# Patient Record
Sex: Female | Born: 1945
Health system: Southern US, Community
[De-identification: ages and names within clinical notes are randomized; demographics above are authoritative.]

## PROBLEM LIST (undated history)

## (undated) DIAGNOSIS — R51 Headache: Secondary | ICD-10-CM

## (undated) DIAGNOSIS — I10 Essential (primary) hypertension: Secondary | ICD-10-CM

## (undated) HISTORY — PX: ABDOMINAL HYSTERECTOMY: SHX81

## (undated) HISTORY — PX: BREAST EXCISIONAL BIOPSY: SUR124

## (undated) HISTORY — DX: Headache: R51

## (undated) HISTORY — DX: Essential (primary) hypertension: I10

---

## 1998-07-29 ENCOUNTER — Encounter: Payer: Self-pay | Admitting: Emergency Medicine

## 1998-07-29 ENCOUNTER — Emergency Department (HOSPITAL_COMMUNITY): Admission: EM | Admit: 1998-07-29 | Discharge: 1998-07-29 | Payer: Self-pay | Admitting: Emergency Medicine

## 1999-05-24 ENCOUNTER — Other Ambulatory Visit: Admission: RE | Admit: 1999-05-24 | Discharge: 1999-05-24 | Payer: Self-pay | Admitting: Obstetrics and Gynecology

## 1999-08-03 ENCOUNTER — Ambulatory Visit (HOSPITAL_COMMUNITY): Admission: RE | Admit: 1999-08-03 | Discharge: 1999-08-03 | Payer: Self-pay | Admitting: Gastroenterology

## 1999-09-18 ENCOUNTER — Ambulatory Visit (HOSPITAL_COMMUNITY): Admission: RE | Admit: 1999-09-18 | Discharge: 1999-09-18 | Payer: Self-pay | Admitting: Obstetrics and Gynecology

## 1999-09-18 ENCOUNTER — Encounter: Payer: Self-pay | Admitting: Obstetrics and Gynecology

## 2000-02-01 ENCOUNTER — Encounter: Admission: RE | Admit: 2000-02-01 | Discharge: 2000-02-01 | Payer: Self-pay | Admitting: Obstetrics and Gynecology

## 2000-02-01 ENCOUNTER — Encounter: Payer: Self-pay | Admitting: Obstetrics and Gynecology

## 2000-09-24 ENCOUNTER — Encounter: Payer: Self-pay | Admitting: Obstetrics and Gynecology

## 2000-09-24 ENCOUNTER — Ambulatory Visit (HOSPITAL_COMMUNITY): Admission: RE | Admit: 2000-09-24 | Discharge: 2000-09-24 | Payer: Self-pay | Admitting: Obstetrics and Gynecology

## 2001-05-11 ENCOUNTER — Encounter: Payer: Self-pay | Admitting: Family Medicine

## 2001-05-11 ENCOUNTER — Ambulatory Visit (HOSPITAL_COMMUNITY): Admission: RE | Admit: 2001-05-11 | Discharge: 2001-05-11 | Payer: Self-pay | Admitting: Family Medicine

## 2001-07-01 ENCOUNTER — Other Ambulatory Visit: Admission: RE | Admit: 2001-07-01 | Discharge: 2001-07-01 | Payer: Self-pay | Admitting: Obstetrics and Gynecology

## 2001-09-30 ENCOUNTER — Ambulatory Visit (HOSPITAL_COMMUNITY): Admission: RE | Admit: 2001-09-30 | Discharge: 2001-09-30 | Payer: Self-pay | Admitting: Obstetrics and Gynecology

## 2001-09-30 ENCOUNTER — Encounter: Payer: Self-pay | Admitting: Obstetrics and Gynecology

## 2002-09-21 ENCOUNTER — Ambulatory Visit (HOSPITAL_COMMUNITY): Admission: RE | Admit: 2002-09-21 | Discharge: 2002-09-21 | Payer: Self-pay | Admitting: Family Medicine

## 2002-09-21 ENCOUNTER — Encounter: Payer: Self-pay | Admitting: Family Medicine

## 2002-09-30 ENCOUNTER — Ambulatory Visit (HOSPITAL_COMMUNITY): Admission: RE | Admit: 2002-09-30 | Discharge: 2002-09-30 | Payer: Self-pay | Admitting: Obstetrics and Gynecology

## 2002-09-30 ENCOUNTER — Encounter: Payer: Self-pay | Admitting: Obstetrics and Gynecology

## 2003-04-15 ENCOUNTER — Encounter (INDEPENDENT_AMBULATORY_CARE_PROVIDER_SITE_OTHER): Payer: Self-pay | Admitting: Specialist

## 2003-04-15 ENCOUNTER — Ambulatory Visit (HOSPITAL_COMMUNITY): Admission: RE | Admit: 2003-04-15 | Discharge: 2003-04-15 | Payer: Self-pay | Admitting: Gastroenterology

## 2003-11-18 ENCOUNTER — Ambulatory Visit (HOSPITAL_COMMUNITY): Admission: RE | Admit: 2003-11-18 | Discharge: 2003-11-18 | Payer: Self-pay | Admitting: Obstetrics and Gynecology

## 2004-11-19 ENCOUNTER — Ambulatory Visit (HOSPITAL_COMMUNITY): Admission: RE | Admit: 2004-11-19 | Discharge: 2004-11-19 | Payer: Self-pay | Admitting: Obstetrics and Gynecology

## 2006-01-01 ENCOUNTER — Encounter: Admission: RE | Admit: 2006-01-01 | Discharge: 2006-01-01 | Payer: Self-pay | Admitting: Obstetrics and Gynecology

## 2007-01-07 ENCOUNTER — Encounter: Admission: RE | Admit: 2007-01-07 | Discharge: 2007-01-07 | Payer: Self-pay | Admitting: Obstetrics and Gynecology

## 2007-10-30 ENCOUNTER — Encounter: Admission: RE | Admit: 2007-10-30 | Discharge: 2007-10-30 | Payer: Self-pay | Admitting: Gastroenterology

## 2008-01-21 ENCOUNTER — Encounter: Admission: RE | Admit: 2008-01-21 | Discharge: 2008-01-21 | Payer: Self-pay | Admitting: Obstetrics and Gynecology

## 2009-01-24 ENCOUNTER — Encounter: Admission: RE | Admit: 2009-01-24 | Discharge: 2009-01-24 | Payer: Self-pay | Admitting: Obstetrics and Gynecology

## 2009-05-26 ENCOUNTER — Ambulatory Visit: Payer: Self-pay | Admitting: Vascular Surgery

## 2010-01-25 ENCOUNTER — Encounter: Admission: RE | Admit: 2010-01-25 | Discharge: 2010-01-25 | Payer: Self-pay | Admitting: Family Medicine

## 2010-10-14 ENCOUNTER — Encounter: Payer: Self-pay | Admitting: Obstetrics and Gynecology

## 2010-10-14 ENCOUNTER — Encounter: Payer: Self-pay | Admitting: Family Medicine

## 2011-01-07 ENCOUNTER — Other Ambulatory Visit: Payer: Self-pay | Admitting: Family Medicine

## 2011-01-07 DIAGNOSIS — Z1231 Encounter for screening mammogram for malignant neoplasm of breast: Secondary | ICD-10-CM

## 2011-02-06 ENCOUNTER — Ambulatory Visit
Admission: RE | Admit: 2011-02-06 | Discharge: 2011-02-06 | Disposition: A | Discharge status: Home / Self Care | Payer: BC Managed Care – PPO | Source: Ambulatory Visit | Attending: Family Medicine | Admitting: Family Medicine

## 2011-02-06 DIAGNOSIS — Z1231 Encounter for screening mammogram for malignant neoplasm of breast: Secondary | ICD-10-CM

## 2011-02-08 NOTE — Op Note (Signed)
NAMEMICHAELINA, Brianna Horne                          ACCOUNT NO.:  [id number]   MEDICAL RECORD NO.:  [id number]                   PATIENT TYPE:  AMB   LOCATION:  ENDO                                 FACILITY:  [facility]   PHYSICIAN:  Petra Kuba, M.D.                 DATE OF BIRTH:  12/30/1945   DATE OF PROCEDURE:  04/15/2003  DATE OF DISCHARGE:                                 OPERATIVE REPORT   PROCEDURE:  Colonoscopy with biopsy.   INDICATIONS:  The patient has recurring bouts of bloody diarrhea, abdominal  pain.  Consent was signed after risks, benefits, methods, and options were  thoroughly discussed in the office.   PREMEDICATIONS:  Demerol 60 mg, Versed 10 mg.   DESCRIPTION OF PROCEDURE:  Rectal inspection pertinent for external  hemorrhoids, small.  Digital exam was negative.  Pediatric video adjustable  colonoscope was inserted and with some difficulty due to a tortuous sigmoid.  Once passed that area, was easily able to advance to the cecum.  This did  require some abdominal pressure but no position changes.  On insertion of  the sigmoid, some scattered inflammation was seen but no chronic ulceration.  No other abnormalities were seen.  It was readvanced to the cecum.  The  cecum was identified by the appendiceal orifice and ileocecal valve.  The  scope was inserted a short ways into the terminal ileum which was normal.  Photo documentation was obtained.  The scope was slowly withdrawn.  Prep was  adequate.  There was minimal some liquid stool that required suctioning in  the lumen.  Slow withdrawal through the colon, the cecum, ascending,  transverse, and the majority of the descending was normal.  There was a rare  left-sided diverticula.  Inflammation that was seen on insertion was  confirmed.  It looked a little more impressive than just the usual prep-  induced inflammation and multiple biopsies were obtained.  No other  abnormalities were seen as we slowly returned back to  the rectum.  Anorectal  pull-through and retroflexion confirmed some hemorrhoids.  The scope was  straightened and readvanced a short ways up the left side of the colon.  Air  was suctioned and the scope removed.  The patient tolerated the procedure  well.  There were no obvious immediate complications.   ENDOSCOPIC DIAGNOSES:  1. Small internal and external hemorrhoids.  2. Rare left-sided diverticula.  3. Tortuous sigmoid.  4. Minimal sigmoid inflammation, status post biopsy.  5. Otherwise within normal limits to the terminal ileum.    PLAN:  Await pathology but I think her symptoms are due to current bouts of  colonic ischemia, probably to her Premarin.  Would recommend she stop the  Premarin.  Talk to her gynecologist about other options if she has hot  flashes or other symptoms.  Possibly just using a lower dose of estrogen can  be  tried.  I will see her back p.r.n. or in two months.  Based on no other  abnormalities, repeat screening in five years.                                                 Petra Kuba, M.D.    MEM/MEDQ  D:  04/15/2003  T:  04/15/2003  Job:  [id number]   cc:   Dario Guardian, M.D.  [address]  [place]  [place] [address]  Fax: [phone]

## 2011-05-13 ENCOUNTER — Other Ambulatory Visit: Payer: Self-pay | Admitting: Family Medicine

## 2011-05-14 ENCOUNTER — Ambulatory Visit
Admission: RE | Admit: 2011-05-14 | Discharge: 2011-05-14 | Disposition: A | Discharge status: Home / Self Care | Payer: BC Managed Care – PPO | Source: Ambulatory Visit | Attending: Family Medicine | Admitting: Family Medicine

## 2012-01-01 ENCOUNTER — Other Ambulatory Visit: Payer: Self-pay | Admitting: Vascular Surgery

## 2012-02-05 ENCOUNTER — Other Ambulatory Visit: Payer: Self-pay | Admitting: Family Medicine

## 2012-02-05 DIAGNOSIS — Z1231 Encounter for screening mammogram for malignant neoplasm of breast: Secondary | ICD-10-CM

## 2012-02-19 ENCOUNTER — Ambulatory Visit: Payer: BC Managed Care – PPO

## 2012-02-20 ENCOUNTER — Ambulatory Visit
Admission: RE | Admit: 2012-02-20 | Discharge: 2012-02-20 | Disposition: A | Discharge status: Home / Self Care | Payer: BC Managed Care – PPO | Source: Ambulatory Visit | Attending: Family Medicine | Admitting: Family Medicine

## 2012-02-20 DIAGNOSIS — Z1231 Encounter for screening mammogram for malignant neoplasm of breast: Secondary | ICD-10-CM

## 2012-10-20 ENCOUNTER — Other Ambulatory Visit: Payer: Self-pay | Admitting: Family Medicine

## 2012-10-20 DIAGNOSIS — Z1231 Encounter for screening mammogram for malignant neoplasm of breast: Secondary | ICD-10-CM

## 2012-11-12 ENCOUNTER — Encounter: Payer: Self-pay | Admitting: Licensed Clinical Social Worker

## 2012-11-12 NOTE — Progress Notes (Signed)
Patient ID: Brianna Horne, female   DOB: 23-Sep-1946, 67 y.o.   MRN: [id number] Confirmed, pt has changed [facility] provider to [facility] [phone].

## 2013-01-11 ENCOUNTER — Encounter: Payer: Self-pay | Admitting: *Deleted

## 2013-02-04 ENCOUNTER — Emergency Department (HOSPITAL_COMMUNITY)
Admission: EM | Admit: 2013-02-04 | Discharge: 2013-02-04 | Disposition: A | Discharge status: Home / Self Care | Payer: Medicare Other | Attending: Emergency Medicine | Admitting: Emergency Medicine

## 2013-02-04 ENCOUNTER — Emergency Department (HOSPITAL_COMMUNITY): Payer: Medicare Other

## 2013-02-04 ENCOUNTER — Encounter (HOSPITAL_COMMUNITY): Payer: Self-pay | Admitting: *Deleted

## 2013-02-04 DIAGNOSIS — Z7982 Long term (current) use of aspirin: Secondary | ICD-10-CM | POA: Insufficient documentation

## 2013-02-04 DIAGNOSIS — R079 Chest pain, unspecified: Secondary | ICD-10-CM

## 2013-02-04 DIAGNOSIS — Z79899 Other long term (current) drug therapy: Secondary | ICD-10-CM | POA: Insufficient documentation

## 2013-02-04 DIAGNOSIS — M25519 Pain in unspecified shoulder: Secondary | ICD-10-CM | POA: Insufficient documentation

## 2013-02-04 DIAGNOSIS — R071 Chest pain on breathing: Secondary | ICD-10-CM | POA: Insufficient documentation

## 2013-02-04 DIAGNOSIS — M546 Pain in thoracic spine: Secondary | ICD-10-CM | POA: Insufficient documentation

## 2013-02-04 LAB — POCT I-STAT, CHEM 8
BUN: 13 mg/dL (ref 6–23)
Creatinine, Ser: 0.7 mg/dL (ref 0.50–1.10)
Glucose, Bld: 93 mg/dL (ref 70–99)
Sodium: 142 mEq/L (ref 135–145)
TCO2: 29 mmol/L (ref 0–100)

## 2013-02-04 LAB — CBC WITH DIFFERENTIAL/PLATELET
Basophils Relative: 1 % (ref 0–1)
Eosinophils Absolute: 0.1 10*3/uL (ref 0.0–0.7)
Eosinophils Relative: 1 % (ref 0–5)
MCH: 31.9 pg (ref 26.0–34.0)
MCHC: 35.5 g/dL (ref 30.0–36.0)
MCV: 89.7 fL (ref 78.0–100.0)
Neutrophils Relative %: 51 % (ref 43–77)
Platelets: 232 10*3/uL (ref 150–400)
RDW: 12.5 % (ref 11.5–15.5)

## 2013-02-04 LAB — POCT I-STAT TROPONIN I

## 2013-02-04 NOTE — ED Notes (Signed)
Urine sample collected from patient. 

## 2013-02-04 NOTE — ED Notes (Signed)
Patient states she started having pain in back around under shoulder blades and into left shoulder, patient denies chest pain and states she has been working outside and may have strained back, patient also states she has been taking her blood pressure at home and has been getting abnormal readings

## 2013-02-04 NOTE — ED Provider Notes (Signed)
History     CSN: [id number]  Arrival date & time 02/04/13  4098   First MD Initiated Contact with Patient 02/04/13 [phone]      Chief Complaint  Patient presents with  . Back Pain  . Shoulder Pain    (Consider location/radiation/quality/duration/timing/severity/associated sxs/prior treatment) Patient is a 67 y.o. female presenting with chest pain. The history is provided by the patient. No language interpreter was used.  Chest Pain Chest pain location: Patient is a 67 year old woman who feels pain in her left posterior chest goes down her left arm to her fingers. She says there is been no injury. She has written a locked riding lawnmower a lot in the past few days. Last night she had the pain more severe. Pain quality: aching   Pain radiates to:  L shoulder and L arm Pain radiates to the back: no (Pain is felt in the left posterior chest wall. )   Pain severity:  Mild Onset quality:  Gradual Duration: Intermittent brief episodes of aching in left posterior chest. Progression:  Waxing and waning Chronicity:  New Context comment:  She thought the pain might have come from riding a riding lawnmower. Relieved by:  Nothing Associated symptoms: back pain   Associated symptoms: no fever   Associated symptoms comment:  Recent treatment of high blood pressure for the past 3 weeks with lisinopril 10 mg per day. Risk factors: no coronary artery disease     History reviewed. No pertinent past medical history.  Past Surgical History  Procedure Laterality Date  . Abdominal hysterectomy      No family history on file.  History  Substance Use Topics  . Smoking status: Never Smoker   . Smokeless tobacco: Not on file  . Alcohol Use: Yes     Comment: wine    OB History   Grav Para Term Preterm Abortions TAB SAB Ect Mult Living                  Review of Systems  Constitutional: Negative for fever and chills.  HENT: Negative.   Eyes: Negative.   Respiratory: Negative.    Cardiovascular: Positive for chest pain.  Gastrointestinal: Negative.   Genitourinary: Negative.   Musculoskeletal: Positive for back pain.       Pain in left posterior chest between left scapula and spine.  Skin: Negative.   Neurological: Negative.   Psychiatric/Behavioral: Negative.     Allergies  Review of patient's allergies indicates no known allergies.  Home Medications   Current Outpatient Rx  Name  Route  Sig  Dispense  Refill  . aspirin EC 81 MG tablet   Oral   Take 81 mg by mouth daily.         . calcium carbonate (OS-CAL) 600 MG TABS   Oral   Take 600 mg by mouth daily.         . Cholecalciferol 1000 UNITS tablet   Oral   Take 1,000 Units by mouth daily.         . cyanocobalamin 1000 MCG tablet   Oral   Take 100 mcg by mouth daily.         Marland Kitchen ibuprofen (ADVIL,MOTRIN) 200 MG tablet   Oral   Take 400 mg by mouth every 6 (six) hours as needed for pain.         Marland Kitchen lisinopril (PRINIVIL,ZESTRIL) 10 MG tablet   Oral   Take 10 mg by mouth daily.         Marland Kitchen  naproxen sodium (ANAPROX) 220 MG tablet   Oral   Take 440 mg by mouth 2 (two) times daily as needed. pain         . vitamin C (ASCORBIC ACID) 500 MG tablet   Oral   Take 500 mg by mouth daily.           BP 142/73  Pulse 69  Temp(Src) 97.5 F (36.4 C) (Oral)  Resp 13  SpO2 100%  Physical Exam  Nursing note and vitals reviewed. Constitutional: She is oriented to person, place, and time. She appears well-developed and well-nourished. No distress.  HENT:  Head: Normocephalic and atraumatic.  Right Ear: External ear normal.  Left Ear: External ear normal.  Eyes: Conjunctivae and EOM are normal. Pupils are equal, round, and reactive to light.  Neck: Normal range of motion. Neck supple.  Cardiovascular: Normal rate, regular rhythm and normal heart sounds.   Pulmonary/Chest: Effort normal and breath sounds normal.  She localizes pain to the left posterior chest wall, between the left  scapula and the thoracic spine.  There is no palpable deformity, no point tenderness, no crepitance or subcutaneous emphysema.    Abdominal: Soft. Bowel sounds are normal.  Musculoskeletal: Normal range of motion. She exhibits no edema and no tenderness.  Neurological: She is alert and oriented to person, place, and time.  No sensory or motor deficit.  Skin: Skin is warm and dry.  Psychiatric: She has a normal mood and affect. Her behavior is normal.    ED Course  Procedures (including critical care time)  9:27 AM  Date: 02/04/2013  Rate: 71  Rhythm: normal sinus rhythm  QRS Axis: normal  Intervals: normal  ST/T Wave abnormalities: normal  Conduction Disutrbances:none  Narrative Interpretation: Normal EKG  Old EKG Reviewed: none available  10:15 AM Pt was seen and had physical examination.  EKG is benign.  Lab tests and chest x-ray were ordered.  12:08 PM Results for orders placed during the hospital encounter of 02/04/13  CBC WITH DIFFERENTIAL      Result Value Range   WBC 5.9  4.0 - 10.5 K/uL   RBC 4.08  3.87 - 5.11 MIL/uL   Hemoglobin 13.0  12.0 - 15.0 g/dL   HCT 96.0  45.4 - 09.8 %   MCV 89.7  78.0 - 100.0 fL   MCH 31.9  26.0 - 34.0 pg   MCHC 35.5  30.0 - 36.0 g/dL   RDW 11.9  14.7 - 82.9 %   Platelets 232  150 - 400 K/uL   Neutrophils Relative % 51  43 - 77 %   Neutro Abs 3.0  1.7 - 7.7 K/uL   Lymphocytes Relative 40  12 - 46 %   Lymphs Abs 2.4  0.7 - 4.0 K/uL   Monocytes Relative 7  3 - 12 %   Monocytes Absolute 0.4  0.1 - 1.0 K/uL   Eosinophils Relative 1  0 - 5 %   Eosinophils Absolute 0.1  0.0 - 0.7 K/uL   Basophils Relative 1  0 - 1 %   Basophils Absolute 0.1  0.0 - 0.1 K/uL  POCT I-STAT, CHEM 8      Result Value Range   Sodium 142  135 - 145 mEq/L   Potassium 4.5  3.5 - 5.1 mEq/L   Chloride 106  96 - 112 mEq/L   BUN 13  6 - 23 mg/dL   Creatinine, Ser 5.62  0.50 - 1.10 mg/dL   Glucose, Bld  93  70 - 99 mg/dL   Calcium, Ion 1.61  0.96 - 1.30 mmol/L    TCO2 29  0 - 100 mmol/L   Hemoglobin 12.9  12.0 - 15.0 g/dL   HCT 04.5  40.9 - 81.1 %  POCT I-STAT TROPONIN I      Result Value Range   Troponin i, poc 0.00  0.00 - 0.08 ng/mL   Comment 3            Dg Chest Port 1 View  02/04/2013   *RADIOLOGY REPORT*  Clinical Data: Chest pain  PORTABLE CHEST - 1 VIEW  Comparison: None.  Findings: Normal heart size and normal vascularity.  Lungs are clear without infiltrate effusion or mass.  IMPRESSION: No acute abnormality.   Original Report Authenticated By: Janeece Riggers, M.D.    Lab workup is entirely negative. Pt is asymptomatic.  Advised that no serious cause of chest pain was found.  Released.  F/U with Merri Brunette, M.D., her family physician.  1. Nonspecific chest pain            Carleene Cooper III, MD 02/04/13 [phone]

## 2013-02-10 ENCOUNTER — Ambulatory Visit (INDEPENDENT_AMBULATORY_CARE_PROVIDER_SITE_OTHER): Payer: Medicare Other | Admitting: Nurse Practitioner

## 2013-02-10 ENCOUNTER — Encounter: Payer: Self-pay | Admitting: Nurse Practitioner

## 2013-02-10 VITALS — BP 110/72 | HR 83 | Ht 63.5 in | Wt 138.0 lb

## 2013-02-10 DIAGNOSIS — R519 Headache, unspecified: Secondary | ICD-10-CM | POA: Insufficient documentation

## 2013-02-10 DIAGNOSIS — R9409 Abnormal results of other function studies of central nervous system: Secondary | ICD-10-CM

## 2013-02-10 DIAGNOSIS — R51 Headache: Secondary | ICD-10-CM

## 2013-02-10 NOTE — Patient Instructions (Addendum)
Patient will have  MRI of the brain repeated in 2016 Followup when necessary

## 2013-02-10 NOTE — Progress Notes (Signed)
HPI: Patient returns for followup after last visit 02/11/2012. She has a history of migraine which is in excellent control and she is no longer taking medication. She also has a history of abnormal MRI of the brain interpreted as small vessel disease she had repeat MRI in January of 2010 without change and again in 12/26/2011. Doppler studies have been negative in the past she takes aspirin daily. Her last headache was several months ago. She has no new complaints today  ROS:  -  flushing, joint pain, aching muscles  Physical Exam General: well developed, well nourished, seated, in no evident distress Head: head normocephalic and atraumatic. Oropharynx benign Neck: supple with no carotid or supraclavicular bruits Cardiovascular: regular rate and rhythm, no murmurs  Neurologic Exam Mental Status: Awake and fully alert. Oriented to place and time.  Follows all commands. Speech and language are normal.  Cranial Nerves:  Pupils equal, briskly reactive to light. Extraocular movements full without nystagmus. Visual fields full to confrontation. Hearing intact and symmetric to finger snap. Facial sensation intact. Face, tongue, palate move normally and symmetrically. Neck flexion and extension normal.  Motor: Normal bulk and tone. Normal strength in all tested extremity muscles. No focal weakness Sensory.: intact to touch and pinprick and vibratory.  Coordination: Rapid alternating movements normal in all extremities. Finger-to-nose and heel-to-shin performed accurately bilaterally. Gait and Station: Arises from chair without difficulty. Stance is normal. Gait demonstrates normal stride length and balance . Able to heel, toe and tandem walkwithout difficulty.  Reflexes: 2+ and symmetric. Toes downgoing.     ASSESSMENT: History of migraines in good control without medication. Last headache was several months ago. Abnormal MRI of the brain interpreted as small vessel disease with last MRI April  2013.     PLAN: Continue ASA daily Will follow up when necessary Will recheck MRI of the brain in 2016  Brianna Horne, [place] APRN

## 2013-02-23 ENCOUNTER — Ambulatory Visit
Admission: RE | Admit: 2013-02-23 | Discharge: 2013-02-23 | Disposition: A | Discharge status: Home / Self Care | Payer: Medicare Other | Source: Ambulatory Visit | Attending: Family Medicine | Admitting: Family Medicine

## 2013-02-23 DIAGNOSIS — Z1231 Encounter for screening mammogram for malignant neoplasm of breast: Secondary | ICD-10-CM

## 2013-03-11 ENCOUNTER — Other Ambulatory Visit: Payer: Self-pay | Admitting: Otolaryngology

## 2013-03-11 ENCOUNTER — Ambulatory Visit
Admission: RE | Admit: 2013-03-11 | Discharge: 2013-03-11 | Disposition: A | Discharge status: Home / Self Care | Payer: Medicare Other | Source: Ambulatory Visit | Attending: Otolaryngology | Admitting: Otolaryngology

## 2013-03-11 DIAGNOSIS — J3489 Other specified disorders of nose and nasal sinuses: Secondary | ICD-10-CM

## 2013-03-11 DIAGNOSIS — R0981 Nasal congestion: Secondary | ICD-10-CM

## 2014-02-18 ENCOUNTER — Other Ambulatory Visit: Payer: Self-pay

## 2014-02-18 DIAGNOSIS — Z1231 Encounter for screening mammogram for malignant neoplasm of breast: Secondary | ICD-10-CM

## 2014-02-24 ENCOUNTER — Ambulatory Visit: Payer: Medicare Other

## 2014-03-01 ENCOUNTER — Other Ambulatory Visit: Payer: Self-pay

## 2014-03-01 ENCOUNTER — Ambulatory Visit
Admission: RE | Admit: 2014-03-01 | Discharge: 2014-03-01 | Disposition: A | Discharge status: Home / Self Care | Payer: Medicare Other | Source: Ambulatory Visit

## 2014-03-01 ENCOUNTER — Encounter (INDEPENDENT_AMBULATORY_CARE_PROVIDER_SITE_OTHER): Payer: Self-pay

## 2014-03-01 DIAGNOSIS — Z1231 Encounter for screening mammogram for malignant neoplasm of breast: Secondary | ICD-10-CM

## 2014-04-28 ENCOUNTER — Ambulatory Visit
Admission: RE | Admit: 2014-04-28 | Discharge: 2014-04-28 | Disposition: A | Discharge status: Home / Self Care | Payer: Medicare Other | Source: Ambulatory Visit | Attending: Family Medicine | Admitting: Family Medicine

## 2014-04-28 ENCOUNTER — Other Ambulatory Visit: Payer: Self-pay | Admitting: Family Medicine

## 2014-04-28 DIAGNOSIS — R0789 Other chest pain: Secondary | ICD-10-CM

## 2015-02-02 ENCOUNTER — Other Ambulatory Visit: Payer: Self-pay

## 2015-02-02 DIAGNOSIS — Z1231 Encounter for screening mammogram for malignant neoplasm of breast: Secondary | ICD-10-CM

## 2015-03-07 ENCOUNTER — Ambulatory Visit
Admission: RE | Admit: 2015-03-07 | Discharge: 2015-03-07 | Disposition: A | Discharge status: Home / Self Care | Payer: Medicare Other | Source: Ambulatory Visit

## 2015-03-07 DIAGNOSIS — Z1231 Encounter for screening mammogram for malignant neoplasm of breast: Secondary | ICD-10-CM

## 2015-06-08 ENCOUNTER — Encounter: Payer: Self-pay | Admitting: Cardiology

## 2015-06-15 ENCOUNTER — Encounter: Payer: Self-pay | Admitting: Internal Medicine

## 2016-02-05 ENCOUNTER — Other Ambulatory Visit: Payer: Self-pay

## 2016-02-05 DIAGNOSIS — Z1231 Encounter for screening mammogram for malignant neoplasm of breast: Secondary | ICD-10-CM

## 2016-03-11 ENCOUNTER — Ambulatory Visit
Admission: RE | Admit: 2016-03-11 | Discharge: 2016-03-11 | Disposition: A | Discharge status: Home / Self Care | Payer: Medicare Other | Source: Ambulatory Visit

## 2016-03-11 DIAGNOSIS — Z1231 Encounter for screening mammogram for malignant neoplasm of breast: Secondary | ICD-10-CM

## 2016-08-10 ENCOUNTER — Emergency Department (HOSPITAL_COMMUNITY): Payer: Medicare Other

## 2016-08-10 ENCOUNTER — Encounter (HOSPITAL_COMMUNITY): Payer: Self-pay

## 2016-08-10 ENCOUNTER — Observation Stay (HOSPITAL_COMMUNITY)
Admission: EM | Admit: 2016-08-10 | Discharge: 2016-08-11 | Disposition: A | Discharge status: Home / Self Care | Payer: Medicare Other | Attending: Family Medicine | Admitting: Family Medicine

## 2016-08-10 DIAGNOSIS — R112 Nausea with vomiting, unspecified: Secondary | ICD-10-CM | POA: Diagnosis not present

## 2016-08-10 DIAGNOSIS — S52371A Galeazzi's fracture of right radius, initial encounter for closed fracture: Principal | ICD-10-CM | POA: Insufficient documentation

## 2016-08-10 DIAGNOSIS — S52501A Unspecified fracture of the lower end of right radius, initial encounter for closed fracture: Secondary | ICD-10-CM

## 2016-08-10 DIAGNOSIS — S62101A Fracture of unspecified carpal bone, right wrist, initial encounter for closed fracture: Secondary | ICD-10-CM

## 2016-08-10 DIAGNOSIS — Z7982 Long term (current) use of aspirin: Secondary | ICD-10-CM | POA: Diagnosis not present

## 2016-08-10 DIAGNOSIS — S52251A Displaced comminuted fracture of shaft of ulna, right arm, initial encounter for closed fracture: Secondary | ICD-10-CM | POA: Diagnosis not present

## 2016-08-10 DIAGNOSIS — S52601A Unspecified fracture of lower end of right ulna, initial encounter for closed fracture: Secondary | ICD-10-CM

## 2016-08-10 DIAGNOSIS — S2222XA Fracture of body of sternum, initial encounter for closed fracture: Secondary | ICD-10-CM | POA: Insufficient documentation

## 2016-08-10 DIAGNOSIS — Z79899 Other long term (current) drug therapy: Secondary | ICD-10-CM | POA: Diagnosis not present

## 2016-08-10 DIAGNOSIS — Z87891 Personal history of nicotine dependence: Secondary | ICD-10-CM | POA: Diagnosis not present

## 2016-08-10 DIAGNOSIS — S62109A Fracture of unspecified carpal bone, unspecified wrist, initial encounter for closed fracture: Secondary | ICD-10-CM

## 2016-08-10 DIAGNOSIS — Z7983 Long term (current) use of bisphosphonates: Secondary | ICD-10-CM | POA: Insufficient documentation

## 2016-08-10 DIAGNOSIS — E876 Hypokalemia: Secondary | ICD-10-CM | POA: Diagnosis not present

## 2016-08-10 DIAGNOSIS — T1490XA Injury, unspecified, initial encounter: Secondary | ICD-10-CM | POA: Diagnosis not present

## 2016-08-10 LAB — CBC WITH DIFFERENTIAL/PLATELET
BASOS PCT: 0 %
Basophils Absolute: 0 10*3/uL (ref 0.0–0.1)
EOS ABS: 0.1 10*3/uL (ref 0.0–0.7)
EOS PCT: 1 %
HCT: 37.8 % (ref 36.0–46.0)
Hemoglobin: 12.7 g/dL (ref 12.0–15.0)
LYMPHS ABS: 1.7 10*3/uL (ref 0.7–4.0)
Lymphocytes Relative: 19 %
MCH: 31.2 pg (ref 26.0–34.0)
MCHC: 33.6 g/dL (ref 30.0–36.0)
MCV: 92.9 fL (ref 78.0–100.0)
Monocytes Absolute: 0.6 10*3/uL (ref 0.1–1.0)
Monocytes Relative: 7 %
NEUTROS PCT: 73 %
Neutro Abs: 6.4 10*3/uL (ref 1.7–7.7)
PLATELETS: 210 10*3/uL (ref 150–400)
RBC: 4.07 MIL/uL (ref 3.87–5.11)
RDW: 12.6 % (ref 11.5–15.5)
WBC: 8.8 10*3/uL (ref 4.0–10.5)

## 2016-08-10 LAB — BASIC METABOLIC PANEL
Anion gap: 8 (ref 5–15)
BUN: 14 mg/dL (ref 6–20)
CALCIUM: 9.5 mg/dL (ref 8.9–10.3)
CHLORIDE: 107 mmol/L (ref 101–111)
CO2: 26 mmol/L (ref 22–32)
CREATININE: 0.68 mg/dL (ref 0.44–1.00)
Glucose, Bld: 155 mg/dL — ABNORMAL HIGH (ref 65–99)
Potassium: 3.2 mmol/L — ABNORMAL LOW (ref 3.5–5.1)
SODIUM: 141 mmol/L (ref 135–145)

## 2016-08-10 MED ORDER — ACETAMINOPHEN 650 MG RE SUPP: 650.0000 mg | Freq: Four times a day (QID) | RECTAL | Status: DC | PRN

## 2016-08-10 MED ORDER — ONDANSETRON HCL 4 MG/2ML IJ SOLN: 4.0000 mg | Freq: Four times a day (QID) | INTRAMUSCULAR | Status: DC | PRN

## 2016-08-10 MED ORDER — SODIUM CHLORIDE 0.9 % IV SOLN
INTRAVENOUS | Status: DC
  Administered 2016-08-10: via INTRAVENOUS

## 2016-08-10 MED ORDER — MORPHINE SULFATE (PF) 4 MG/ML IV SOLN
4.0000 mg | Freq: Once | INTRAVENOUS | Status: AC
  Administered 2016-08-10: 4 mg via INTRAVENOUS
  Filled 2016-08-10: qty 1

## 2016-08-10 MED ORDER — IOPAMIDOL (ISOVUE-300) INJECTION 61%
INTRAVENOUS | Status: AC
  Administered 2016-08-10: 100 mL
  Filled 2016-08-10: qty 100

## 2016-08-10 MED ORDER — ONDANSETRON HCL 4 MG PO TABS: 4.0000 mg | ORAL_TABLET | Freq: Four times a day (QID) | ORAL | Status: DC | PRN

## 2016-08-10 MED ORDER — PROCHLORPERAZINE EDISYLATE 5 MG/ML IJ SOLN
10.0000 mg | Freq: Once | INTRAMUSCULAR | Status: AC
  Administered 2016-08-10: 10 mg via INTRAVENOUS
  Filled 2016-08-10: qty 2

## 2016-08-10 MED ORDER — ACETAMINOPHEN 325 MG PO TABS: 650.0000 mg | ORAL_TABLET | Freq: Four times a day (QID) | ORAL | Status: DC | PRN

## 2016-08-10 MED ORDER — HYDROCODONE-ACETAMINOPHEN 5-325 MG PO TABS
1.0000 | ORAL_TABLET | Freq: Once | ORAL | Status: AC
Start: 2016-08-10 — End: 2016-08-10
  Administered 2016-08-10: 1 via ORAL
  Filled 2016-08-10: qty 1

## 2016-08-10 MED ORDER — HYDROCODONE-ACETAMINOPHEN 5-325 MG PO TABS: 1.0000 | ORAL_TABLET | ORAL | 0 refills | Status: DC | PRN

## 2016-08-10 MED ORDER — ONDANSETRON 4 MG PO TBDP: 4.0000 mg | ORAL_TABLET | Freq: Three times a day (TID) | ORAL | 0 refills | Status: DC | PRN

## 2016-08-10 MED ORDER — IBUPROFEN 200 MG PO TABS: 400.0000 mg | ORAL_TABLET | Freq: Four times a day (QID) | ORAL | Status: DC | PRN

## 2016-08-10 MED ORDER — POTASSIUM CHLORIDE CRYS ER 20 MEQ PO TBCR
40.0000 meq | EXTENDED_RELEASE_TABLET | Freq: Once | ORAL | Status: AC
  Administered 2016-08-10: 40 meq via ORAL
  Filled 2016-08-10: qty 2

## 2016-08-10 MED ORDER — SODIUM CHLORIDE 0.9 % IV BOLUS (SEPSIS)
1000.0000 mL | Freq: Once | INTRAVENOUS | Status: AC
  Administered 2016-08-10: 1000 mL via INTRAVENOUS

## 2016-08-10 MED ORDER — METOCLOPRAMIDE HCL 5 MG/ML IJ SOLN
10.0000 mg | Freq: Once | INTRAMUSCULAR | Status: DC
  Filled 2016-08-10: qty 2

## 2016-08-10 MED ORDER — ONDANSETRON HCL 4 MG/2ML IJ SOLN
4.0000 mg | Freq: Once | INTRAMUSCULAR | Status: AC
  Administered 2016-08-10: 4 mg via INTRAVENOUS
  Filled 2016-08-10: qty 2

## 2016-08-10 MED ORDER — FENTANYL CITRATE (PF) 100 MCG/2ML IJ SOLN
12.5000 ug | INTRAMUSCULAR | Status: DC | PRN
  Administered 2016-08-11 (×2): 12.5 ug via INTRAVENOUS
  Filled 2016-08-10 (×2): qty 2

## 2016-08-10 MED ORDER — ENOXAPARIN SODIUM 40 MG/0.4ML ~~LOC~~ SOLN: 40.0000 mg | SUBCUTANEOUS | Status: DC

## 2016-08-10 NOTE — ED Notes (Signed)
Emily, PA at the bedside . 

## 2016-08-10 NOTE — Progress Notes (Signed)
Orthopedic Tech Progress Note Patient Details:  Brianna Horne 11/13/1945 [id number]  Ortho Devices Type of Ortho Device: Short arm splint, Arm sling Ortho Device/Splint Location: Right Arm Splint Applied/ Arm Sling was left with patient as patient or nurse was unsure of D/c time. Ortho Device/Splint Interventions: Application   Alvina ChouWilliams, [place] C 08/10/2016, 6:12 PM
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
## 2016-08-10 NOTE — ED Notes (Signed)
Ortho to place splint
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
## 2016-08-10 NOTE — ED Notes (Signed)
Pt reports allergy to Reglan

## 2016-08-10 NOTE — ED Notes (Signed)
Resident at bedside.  

## 2016-08-10 NOTE — ED Notes (Signed)
Pt stood up to ambulate to go home, and was reported to have an episode of vomiting. Pt is pale and cool. Pt helped back in the bed and MD made aware

## 2016-08-10 NOTE — ED Notes (Signed)
Paged Ortho to apply short splint

## 2016-08-10 NOTE — ED Notes (Signed)
Phlebotomy at the bedside  

## 2016-08-10 NOTE — ED Notes (Signed)
Admitting MD at bedside.

## 2016-08-10 NOTE — ED Notes (Signed)
When pt returned from restroom she began actively vomiting.  MD notified.

## 2016-08-10 NOTE — ED Notes (Signed)
Pt returned from CT °

## 2016-08-10 NOTE — ED Notes (Signed)
Pt was taken to CT

## 2016-08-10 NOTE — ED Notes (Signed)
Ortho Tech at the bedside applying short splint

## 2016-08-10 NOTE — ED Notes (Signed)
Pt ambulated to bathroom with stand by assist. Pt had normal gait and denied any weakness. Pt did state that she felt "a little bit dizzy". [place] RN made aware of such.

## 2016-08-10 NOTE — ED Notes (Signed)
Plan of care discussed with MD Katrinka BlazingSmith and pt and husband informed of plan for discharge today and go for surgery on Monday or Wednesday. Pt verbalized understanding.
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
## 2016-08-10 NOTE — ED Notes (Signed)
Pt taken to Xray and CT

## 2016-08-10 NOTE — ED Notes (Signed)
Pts husband, Corrie MckusickKenny Lewis, [phone]
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
## 2016-08-10 NOTE — Discharge Instructions (Signed)
Return for severe pain not controlled with home medications, numbness in hand, or other new or worsening symptoms.   You may take tylenol or ibuprofen for mild pain. You may take your prescription medication for moderate to severe pain. Do not take Tylenol at the same time as Norco (your prescription medication) as Norco already contains Tylenol.

## 2016-08-11 ENCOUNTER — Observation Stay (HOSPITAL_COMMUNITY): Payer: Medicare Other | Admitting: Anesthesiology

## 2016-08-11 ENCOUNTER — Encounter (HOSPITAL_COMMUNITY)
Admission: EM | Disposition: A | Discharge status: Home / Self Care | Payer: Self-pay | Source: Home / Self Care | Attending: Emergency Medicine

## 2016-08-11 DIAGNOSIS — S52251A Displaced comminuted fracture of shaft of ulna, right arm, initial encounter for closed fracture: Secondary | ICD-10-CM | POA: Diagnosis not present

## 2016-08-11 DIAGNOSIS — S2222XA Fracture of body of sternum, initial encounter for closed fracture: Secondary | ICD-10-CM | POA: Diagnosis not present

## 2016-08-11 DIAGNOSIS — R112 Nausea with vomiting, unspecified: Secondary | ICD-10-CM | POA: Diagnosis not present

## 2016-08-11 DIAGNOSIS — S52371A Galeazzi's fracture of right radius, initial encounter for closed fracture: Secondary | ICD-10-CM | POA: Diagnosis not present

## 2016-08-11 DIAGNOSIS — S62101A Fracture of unspecified carpal bone, right wrist, initial encounter for closed fracture: Secondary | ICD-10-CM | POA: Diagnosis not present

## 2016-08-11 HISTORY — PX: ORIF RADIAL FRACTURE: SHX5113

## 2016-08-11 LAB — CBC
HCT: 36.4 % (ref 36.0–46.0)
Hemoglobin: 12 g/dL (ref 12.0–15.0)
MCH: 31.1 pg (ref 26.0–34.0)
MCHC: 33 g/dL (ref 30.0–36.0)
MCV: 94.3 fL (ref 78.0–100.0)
PLATELETS: 211 10*3/uL (ref 150–400)
RBC: 3.86 MIL/uL — AB (ref 3.87–5.11)
RDW: 12.9 % (ref 11.5–15.5)
WBC: 9.5 10*3/uL (ref 4.0–10.5)

## 2016-08-11 LAB — BASIC METABOLIC PANEL
Anion gap: 7 (ref 5–15)
BUN: 9 mg/dL (ref 6–20)
CALCIUM: 8.2 mg/dL — AB (ref 8.9–10.3)
CHLORIDE: 108 mmol/L (ref 101–111)
CO2: 26 mmol/L (ref 22–32)
CREATININE: 0.64 mg/dL (ref 0.44–1.00)
Glucose, Bld: 89 mg/dL (ref 65–99)
Potassium: 3.4 mmol/L — ABNORMAL LOW (ref 3.5–5.1)
SODIUM: 141 mmol/L (ref 135–145)

## 2016-08-11 LAB — MRSA PCR SCREENING: MRSA BY PCR: NEGATIVE

## 2016-08-11 SURGERY — OPEN REDUCTION INTERNAL FIXATION (ORIF) RADIAL FRACTURE
Anesthesia: General | Site: Arm Lower | Laterality: Right

## 2016-08-11 MED ORDER — EPHEDRINE 5 MG/ML INJ
INTRAVENOUS | Status: AC
  Filled 2016-08-11: qty 10

## 2016-08-11 MED ORDER — DEXAMETHASONE SODIUM PHOSPHATE 10 MG/ML IJ SOLN
INTRAMUSCULAR | Status: DC | PRN
  Administered 2016-08-11: 10 mg via INTRAVENOUS

## 2016-08-11 MED ORDER — LACTATED RINGERS IV SOLN
INTRAVENOUS | Status: DC
  Administered 2016-08-11 (×3): via INTRAVENOUS

## 2016-08-11 MED ORDER — KETOROLAC TROMETHAMINE 30 MG/ML IJ SOLN: 15.0000 mg | Freq: Once | INTRAMUSCULAR | Status: DC | PRN

## 2016-08-11 MED ORDER — BUPIVACAINE HCL (PF) 0.5 % IJ SOLN
INTRAMUSCULAR | Status: DC | PRN
  Administered 2016-08-11: 25 mL via PERINEURAL

## 2016-08-11 MED ORDER — EPHEDRINE SULFATE 50 MG/ML IJ SOLN
INTRAMUSCULAR | Status: DC | PRN
  Administered 2016-08-11: 10 mg via INTRAVENOUS

## 2016-08-11 MED ORDER — SCOPOLAMINE 1 MG/3DAYS TD PT72
MEDICATED_PATCH | TRANSDERMAL | Status: DC | PRN
  Administered 2016-08-11: 1 via TRANSDERMAL

## 2016-08-11 MED ORDER — LIDOCAINE 2% (20 MG/ML) 5 ML SYRINGE
INTRAMUSCULAR | Status: DC | PRN
  Administered 2016-08-11: 80 mg via INTRAVENOUS

## 2016-08-11 MED ORDER — MIDAZOLAM HCL 2 MG/2ML IJ SOLN
INTRAMUSCULAR | Status: AC
  Filled 2016-08-11: qty 2

## 2016-08-11 MED ORDER — PROMETHAZINE HCL 25 MG/ML IJ SOLN: 6.2500 mg | INTRAMUSCULAR | Status: DC | PRN

## 2016-08-11 MED ORDER — ONDANSETRON HCL 4 MG/2ML IJ SOLN
INTRAMUSCULAR | Status: DC | PRN
  Administered 2016-08-11: 4 mg via INTRAVENOUS

## 2016-08-11 MED ORDER — FENTANYL CITRATE (PF) 100 MCG/2ML IJ SOLN
INTRAMUSCULAR | Status: AC
  Filled 2016-08-11: qty 2

## 2016-08-11 MED ORDER — MIDAZOLAM HCL 2 MG/2ML IJ SOLN
INTRAMUSCULAR | Status: DC | PRN
  Administered 2016-08-11: 2 mg via INTRAVENOUS

## 2016-08-11 MED ORDER — CEFAZOLIN SODIUM-DEXTROSE 2-3 GM-% IV SOLR
INTRAVENOUS | Status: DC | PRN
  Administered 2016-08-11: 2 g via INTRAVENOUS

## 2016-08-11 MED ORDER — PROPOFOL 10 MG/ML IV BOLUS
INTRAVENOUS | Status: AC
  Filled 2016-08-11: qty 20

## 2016-08-11 MED ORDER — PROPOFOL 10 MG/ML IV BOLUS
INTRAVENOUS | Status: DC | PRN
  Administered 2016-08-11: 100 mg via INTRAVENOUS

## 2016-08-11 MED ORDER — 0.9 % SODIUM CHLORIDE (POUR BTL) OPTIME
TOPICAL | Status: DC | PRN
  Administered 2016-08-11: 1000 mL

## 2016-08-11 MED ORDER — CEFAZOLIN SODIUM-DEXTROSE 2-4 GM/100ML-% IV SOLN
INTRAVENOUS | Status: AC
  Filled 2016-08-11: qty 100

## 2016-08-11 MED ORDER — POTASSIUM CHLORIDE CRYS ER 20 MEQ PO TBCR: 40.0000 meq | EXTENDED_RELEASE_TABLET | Freq: Once | ORAL | Status: DC

## 2016-08-11 MED ORDER — ONDANSETRON HCL 4 MG/2ML IJ SOLN
INTRAMUSCULAR | Status: AC
  Filled 2016-08-11: qty 2

## 2016-08-11 MED ORDER — FENTANYL CITRATE (PF) 100 MCG/2ML IJ SOLN: 25.0000 ug | INTRAMUSCULAR | Status: DC | PRN

## 2016-08-11 MED ORDER — DEXAMETHASONE SODIUM PHOSPHATE 10 MG/ML IJ SOLN
INTRAMUSCULAR | Status: AC
  Filled 2016-08-11: qty 1

## 2016-08-11 MED ORDER — SCOPOLAMINE 1 MG/3DAYS TD PT72
MEDICATED_PATCH | TRANSDERMAL | Status: AC
  Filled 2016-08-11: qty 1

## 2016-08-11 SURGICAL SUPPLY — 71 items
APL SKNCLS STERI-STRIP NONHPOA (GAUZE/BANDAGES/DRESSINGS) ×1
BANDAGE ACE 4X5 VEL STRL LF (GAUZE/BANDAGES/DRESSINGS) ×2 IMPLANT
BANDAGE ELASTIC 3 VELCRO ST LF (GAUZE/BANDAGES/DRESSINGS) ×2 IMPLANT
BANDAGE ELASTIC 4 LF NS (GAUZE/BANDAGES/DRESSINGS) ×2 IMPLANT
BENZOIN TINCTURE PRP APPL 2/3 (GAUZE/BANDAGES/DRESSINGS) ×2 IMPLANT
BIT DRILL 1.7 (BIT) ×4 IMPLANT
BIT DRILL 2.5 CANN REUSE (DRILL) ×2 IMPLANT
BNDG CMPR 9X4 STRL LF SNTH (GAUZE/BANDAGES/DRESSINGS) ×1
BNDG CMPR MD 5X2 ELC HKLP STRL (GAUZE/BANDAGES/DRESSINGS) ×1
BNDG CMPR MED 5X4 ELC HKLP NS (GAUZE/BANDAGES/DRESSINGS) ×1
BNDG ELASTIC 2 VLCR STRL LF (GAUZE/BANDAGES/DRESSINGS) ×2 IMPLANT
BNDG ESMARK 4X9 LF (GAUZE/BANDAGES/DRESSINGS) ×2 IMPLANT
BNDG GAUZE ELAST 4 BULKY (GAUZE/BANDAGES/DRESSINGS) ×4 IMPLANT
CORDS BIPOLAR (ELECTRODE) IMPLANT
CORTEX SCREW 2.4X22MM VAL NEAR (Screw) ×4 IMPLANT
COVER SURGICAL LIGHT HANDLE (MISCELLANEOUS) ×2 IMPLANT
CUFF TOURNIQUET SINGLE 18IN (TOURNIQUET CUFF) ×2 IMPLANT
DRAPE OEC MINIVIEW 54X84 (DRAPES) ×2 IMPLANT
DRAPE SURG 17X23 STRL (DRAPES) ×2 IMPLANT
DURAPREP 26ML APPLICATOR (WOUND CARE) ×2 IMPLANT
ELECT REM PT RETURN 9FT ADLT (ELECTROSURGICAL)
ELECTRODE REM PT RTRN 9FT ADLT (ELECTROSURGICAL) IMPLANT
GAUZE SPONGE 4X4 12PLY STRL (GAUZE/BANDAGES/DRESSINGS) ×2 IMPLANT
GAUZE XEROFORM 1X8 LF (GAUZE/BANDAGES/DRESSINGS) ×2 IMPLANT
GLOVE SURG SYN 8.0 (GLOVE) ×2 IMPLANT
GOWN STRL REUS W/ TWL LRG LVL3 (GOWN DISPOSABLE) ×1 IMPLANT
GOWN STRL REUS W/ TWL XL LVL3 (GOWN DISPOSABLE) ×1 IMPLANT
GOWN STRL REUS W/TWL LRG LVL3 (GOWN DISPOSABLE) ×2
GOWN STRL REUS W/TWL XL LVL3 (GOWN DISPOSABLE) ×2
KIT BASIN OR (CUSTOM PROCEDURE TRAY) ×2 IMPLANT
KIT ROOM TURNOVER OR (KITS) ×2 IMPLANT
MANIFOLD NEPTUNE II (INSTRUMENTS) ×2 IMPLANT
NEEDLE HYPO 25GX1X1/2 BEV (NEEDLE) IMPLANT
NS IRRIG 1000ML POUR BTL (IV SOLUTION) ×2 IMPLANT
PACK ORTHO EXTREMITY (CUSTOM PROCEDURE TRAY) ×2 IMPLANT
PAD ARMBOARD 7.5X6 YLW CONV (MISCELLANEOUS) ×4 IMPLANT
PAD CAST 3X4 CTTN HI CHSV (CAST SUPPLIES) ×1 IMPLANT
PAD CAST 4YDX4 CTTN HI CHSV (CAST SUPPLIES) ×1 IMPLANT
PADDING CAST COTTON 3X4 STRL (CAST SUPPLIES) ×2
PADDING CAST COTTON 4X4 STRL (CAST SUPPLIES) ×2
PADDING CAST SYNTHETIC 3 NS LF (CAST SUPPLIES) ×1
PADDING CAST SYNTHETIC 3X4 NS (CAST SUPPLIES) ×1 IMPLANT
PENCIL BUTTON HOLSTER BLD 10FT (ELECTRODE) IMPLANT
PLATE 2.4MM 2H L TI 5H RT (Plate) ×2 IMPLANT
PLATE VOLAR DIST RT 5H (Plate) ×2 IMPLANT
SCREW CORTEX 2.4X22MM VAL NEAR (Screw) ×2 IMPLANT
SCREW CORTEX VAL NEAR 2.4X18MM (Screw) ×4 IMPLANT
SCREW CORTEX VAL NEAR 2.4X20MM (Screw) ×4 IMPLANT
SCREW LOCK 14X2.4XSTVA (Screw) ×3 IMPLANT
SCREW LOCK 16X2.4XSTVA (Screw) ×1 IMPLANT
SCREW LOCKING 2.4X14 (Screw) ×6 IMPLANT
SCREW LOCKING 2.4X16 (Screw) ×2 IMPLANT
SCREW LP TI 3.5X13MM (Screw) ×6 IMPLANT
SCREW LP TI 3.5X14MM (Screw) ×4 IMPLANT
SCREW VAL TI 2.4X12MM (Screw) ×2 IMPLANT
SLING ARM FOAM STRAP MED (SOFTGOODS) ×2 IMPLANT
SPONGE GAUZE 4X4 12PLY STER LF (GAUZE/BANDAGES/DRESSINGS) ×2 IMPLANT
SPONGE LAP 4X18 X RAY DECT (DISPOSABLE) ×2 IMPLANT
STRIP CLOSURE SKIN 1/2X4 (GAUZE/BANDAGES/DRESSINGS) IMPLANT
SUT PROLENE 3 0 PS 1 (SUTURE) ×2 IMPLANT
SUT PROLENE 3 0 PS 2 (SUTURE) ×2 IMPLANT
SUT VIC AB 0 CT1 27 (SUTURE) ×2
SUT VIC AB 0 CT1 27XBRD ANBCTR (SUTURE) ×1 IMPLANT
SUT VICRYL 4-0 PS2 18IN ABS (SUTURE) IMPLANT
SYR CONTROL 10ML LL (SYRINGE) IMPLANT
TAPE STRIPS DRAPE STRL (GAUZE/BANDAGES/DRESSINGS) ×2 IMPLANT
TOWEL OR 17X24 6PK STRL BLUE (TOWEL DISPOSABLE) ×2 IMPLANT
TOWEL OR 17X26 10 PK STRL BLUE (TOWEL DISPOSABLE) ×2 IMPLANT
TUBE CONNECTING 12X1/4 (SUCTIONS) IMPLANT
UNDERPAD 30X30 (UNDERPADS AND DIAPERS) ×2 IMPLANT
WATER STERILE IRR 1000ML POUR (IV SOLUTION) ×2 IMPLANT

## 2016-08-11 NOTE — Anesthesia Postprocedure Evaluation (Signed)
Anesthesia Post Note  Patient: Brianna Horne  Procedure(s) Performed: Procedure(s) (LRB): OPEN REDUCTION INTERNAL FIXATION (ORIF) RADIAL FRACTURE, OPEN REDUCTION INTERNAL FIXATION ULNA (Right)  Patient location during evaluation: PACU Anesthesia Type: General and Regional Level of consciousness: awake and alert Pain management: pain level controlled Vital Signs Assessment: post-procedure vital signs reviewed and stable Respiratory status: spontaneous breathing, nonlabored ventilation, respiratory function stable and patient connected to nasal cannula oxygen Cardiovascular status: blood pressure returned to baseline and stable Postop Assessment: no signs of nausea or vomiting Anesthetic complications: no    Last Vitals:  Vitals:   08/11/16 1155 08/11/16 1210  BP: (!) 144/74   Pulse: 79   Resp: 13   Temp:  36.5 C    Last Pain:  Vitals:   08/11/16 0628  TempSrc:   PainSc: 4                  Lovenia Debruler S

## 2016-08-11 NOTE — Anesthesia Preprocedure Evaluation (Signed)
Anesthesia Evaluation  Patient identified by MRN, date of birth, ID band Patient awake    Reviewed: Allergy & Precautions, NPO status , Patient's Chart, lab work & pertinent test results  Airway Mallampati: II  TM Distance: >3 FB Neck ROM: Full    Dental no notable dental hx.    Pulmonary neg pulmonary ROS, former smoker,    Pulmonary exam normal breath sounds clear to auscultation       Cardiovascular negative cardio ROS Normal cardiovascular exam Rhythm:Regular Rate:Normal     Neuro/Psych negative neurological ROS  negative psych ROS   GI/Hepatic negative GI ROS, Neg liver ROS,   Endo/Other  negative endocrine ROS  Renal/GU negative Renal ROS  negative genitourinary   Musculoskeletal negative musculoskeletal ROS (+)   Abdominal   Peds negative pediatric ROS (+)  Hematology negative hematology ROS (+)   Anesthesia Other Findings   Reproductive/Obstetrics negative OB ROS                             Anesthesia Physical Anesthesia Plan  ASA: I and emergent  Anesthesia Plan: General   Post-op Pain Management: GA combined w/ Regional for post-op pain   Induction: Intravenous  Airway Management Planned: LMA  Additional Equipment:   Intra-op Plan:   Post-operative Plan: Extubation in OR  Informed Consent: I have reviewed the patients History and Physical, chart, labs and discussed the procedure including the risks, benefits and alternatives for the proposed anesthesia with the patient or authorized representative who has indicated his/her understanding and acceptance.   Dental advisory given  Plan Discussed with: CRNA and Surgeon  Anesthesia Plan Comments:         Anesthesia Quick Evaluation

## 2016-08-11 NOTE — Transfer of Care (Signed)
Immediate Anesthesia Transfer of Care Note  Patient: Brianna Horne  Procedure(s) Performed: Procedure(s): OPEN REDUCTION INTERNAL FIXATION (ORIF) RADIAL FRACTURE, OPEN REDUCTION INTERNAL FIXATION ULNA (Right)  Patient Location: PACU  Anesthesia Type:General  Level of Consciousness: awake  Airway & Oxygen Therapy: Patient Spontanous Breathing and Patient connected to nasal cannula oxygen  Post-op Assessment: Report given to RN and Post -op Vital signs reviewed and stable  Post vital signs: Reviewed and stable  Last Vitals:  Vitals:   08/10/16 2353 08/11/16 0601  BP: (!) 153/77 (!) 143/64  Pulse: 84 78  Resp: 16 16  Temp: 36.5 C 36.8 C    Last Pain:  Vitals:   08/11/16 0628  TempSrc:   PainSc: 4          Complications: No apparent anesthesia complications

## 2016-08-11 NOTE — Progress Notes (Signed)
Patient underwent ORIF right forearm fracture today  Will need to see in my office on 11/27  Pain meds given to family

## 2016-08-11 NOTE — Op Note (Signed)
NAMKris Horne:  [place], [place]                ACCOUNT NO.:  [id number]  MEDICAL RECORD NO.:  [id number]  LOCATION:  D35C                         FACILITY:  [facility]  PHYSICIAN:  Artist PaisMatthew A. Pavel Gadd, M.D.DATE OF BIRTH:  1946-01-08  DATE OF PROCEDURE:  08/11/2016 DATE OF DISCHARGE:                              OPERATIVE REPORT   PREOPERATIVE DIAGNOSIS:  Displaced distal third right radius and right ulna fractures.  POSTOPERATIVE DIAGNOSIS:  Displaced distal third right radius and right ulna fractures.  PROCEDURE:  Open reduction and internal fixation of displaced distal third radius and ulnar shaft fractures on the right.  SURGEON:  Artist PaisMatthew A. Mina MarbleWeingold, M.D.  ASSISTANT:  None.  ANESTHESIA:  Axillary block and general.  COMPLICATIONS:  None.  DRAINS:  No drains.  DESCRIPTION OF PROCEDURE:  The patient was taken to the operating suite. After induction of adequate axillary block analgesia and then general laryngeal mask air anesthetic, the right upper extremity was prepped and draped in usual sterile fashion.  An Esmarch was used to exsanguinate the limb.  The tourniquet was inflated to 250 mmHg.  At this point in time, an incision was made on the volar aspect of the distal forearm and wrist area on the right side.  Skin was incised sharply.  Dissection was carried down to the interval between the flexor carpi radialis tendon and the radial artery.  This was incised.  The FCR was retracted to the midline, the radial artery lateral side.  The fascia was incised. Dissection was carried down the level of the pronator quadratus.  This was stripped off the volar distal radius revealing a displaced volar displaced fracture distal radius, Galeazzi in nature.  We performed reduction with a rolled up towel with extension over that.  We placed a long Arthrex volar plating system plate and then under fluoroscopic imaging to determine the appropriate plate position we fixed it with cortical screws  proximally x4, followed by the smooth pegs distally. Intraoperative fluoroscopy revealed adequate reduction in AP, lateral, and obliques view.  We then made an incision of the subcutaneous border at the ulna and we incised between the ECU and the FCU.  We dissected down to the ulnar fracture site which was comminuted.  We carefully placed a 2.4-mm plate L-shaped with 2 screws distally into the distal fragment followed by 2 cortical screws proximally.  We also used a 0 Vicryl cerclage wire around some comminution in the midshaft area. Intraoperative fluoroscopy revealed adequate reduction in AP, lateral and oblique view.  We then thoroughly irrigated both wounds.  We loosely closed in layers of 2-0 undyed Vicryl to cover the pronator quadratus in the plate volarly, 2-0 undyed Vicryl subcutaneously and 3-0 Prolene subcuticular stitches were used on the skin.  At the end of the procedure, the distal radioulnar joint was stable.  The patient was then placed in a sterile dressing of 4x4s, fluffs, and a volar splint.  The patient tolerated all procedures well and went to recovery room in stable fashion.     Artist PaisMatthew A. Mina MarbleWeingold, M.D.     MAW/MEDQ  D:  08/11/2016  T:  08/11/2016  Job:  [id number]

## 2016-08-11 NOTE — Op Note (Signed)
See dictated note.

## 2016-08-11 NOTE — Anesthesia Procedure Notes (Signed)
Procedure Name: LMA Insertion Date/Time: 08/11/2016 9:45 AM Performed by: Alanda AmassFRIEDMAN, Clarisa Danser A Pre-anesthesia Checklist: Patient identified, Emergency Drugs available, Suction available, Patient being monitored and Timeout performed Patient Re-evaluated:Patient Re-evaluated prior to inductionOxygen Delivery Method: Circle System Utilized and Circle system utilized Preoxygenation: Pre-oxygenation with 100% oxygen Intubation Type: IV induction Ventilation: Mask ventilation without difficulty LMA: LMA inserted LMA Size: 4.0 Number of attempts: 1 Placement Confirmation: positive ETCO2 Tube secured with: Tape Dental Injury: Teeth and Oropharynx as per pre-operative assessment
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
## 2016-08-11 NOTE — Anesthesia Procedure Notes (Addendum)
Anesthesia Regional Block:  Supraclavicular block  Pre-Anesthetic Checklist: ,, timeout performed, Correct Patient, Correct Site, Correct Laterality, Correct Procedure, Correct Position, site marked, Risks and benefits discussed,  Surgical consent,  Pre-op evaluation,  At surgeon's request and post-op pain management  Laterality: Right  Prep: chloraprep       Needles:  Injection technique: Single-shot  Needle Type: Echogenic Needle     Needle Length: 9cm 9 cm Needle Gauge: 21 G    Additional Needles:  Procedures: ultrasound guided (picture in chart) Supraclavicular block Narrative:  Start time: 08/11/2016 8:00 AM End time: 08/11/2016 8:10 AM Injection made incrementally with aspirations every 5 mL.  Performed by: Personally  Anesthesiologist: Caira Poche  Additional Notes: Risks, benefits and alternative to block explained extensively.  Patient tolerated procedure well, without complications.

## 2016-08-12 ENCOUNTER — Encounter (HOSPITAL_COMMUNITY): Payer: Self-pay | Admitting: Orthopedic Surgery

## 2016-08-16 ENCOUNTER — Emergency Department (HOSPITAL_BASED_OUTPATIENT_CLINIC_OR_DEPARTMENT_OTHER): Payer: Medicare Other

## 2016-08-16 ENCOUNTER — Encounter (HOSPITAL_BASED_OUTPATIENT_CLINIC_OR_DEPARTMENT_OTHER): Payer: Self-pay

## 2016-08-16 ENCOUNTER — Emergency Department (HOSPITAL_BASED_OUTPATIENT_CLINIC_OR_DEPARTMENT_OTHER)
Admission: EM | Admit: 2016-08-16 | Discharge: 2016-08-16 | Disposition: A | Discharge status: Home / Self Care | Payer: Medicare Other | Attending: Emergency Medicine | Admitting: Emergency Medicine

## 2016-08-16 DIAGNOSIS — K5900 Constipation, unspecified: Secondary | ICD-10-CM

## 2016-08-16 DIAGNOSIS — Z87891 Personal history of nicotine dependence: Secondary | ICD-10-CM | POA: Insufficient documentation

## 2016-08-16 DIAGNOSIS — N390 Urinary tract infection, site not specified: Secondary | ICD-10-CM | POA: Diagnosis not present

## 2016-08-16 DIAGNOSIS — Z7982 Long term (current) use of aspirin: Secondary | ICD-10-CM | POA: Diagnosis not present

## 2016-08-16 LAB — URINE MICROSCOPIC-ADD ON: RBC / HPF: NONE SEEN RBC/hpf (ref 0–5)

## 2016-08-16 LAB — URINALYSIS, ROUTINE W REFLEX MICROSCOPIC
BILIRUBIN URINE: NEGATIVE
GLUCOSE, UA: NEGATIVE mg/dL
Hgb urine dipstick: NEGATIVE
KETONES UR: 15 mg/dL — AB
NITRITE: NEGATIVE
PROTEIN: NEGATIVE mg/dL
Specific Gravity, Urine: 1.02 (ref 1.005–1.030)
pH: 7.5 (ref 5.0–8.0)

## 2016-08-16 MED ORDER — CEPHALEXIN 250 MG PO CAPS
ORAL_CAPSULE | ORAL | Status: AC
  Filled 2016-08-16: qty 2

## 2016-08-16 MED ORDER — CEPHALEXIN 500 MG PO CAPS: 500.0000 mg | ORAL_CAPSULE | Freq: Two times a day (BID) | ORAL | 0 refills | Status: DC

## 2016-08-16 MED ORDER — CEPHALEXIN 250 MG PO CAPS
500.0000 mg | ORAL_CAPSULE | Freq: Once | ORAL | Status: AC
  Administered 2016-08-16: 500 mg via ORAL

## 2016-08-16 MED ORDER — FLEET ENEMA 7-19 GM/118ML RE ENEM
1.0000 | ENEMA | Freq: Once | RECTAL | Status: AC
  Administered 2016-08-16: 1 via RECTAL
  Filled 2016-08-16: qty 1

## 2016-08-16 NOTE — Discharge Instructions (Signed)
1. Take colace 1-2 capsules daily. 2. Take miralax 1 capful mixed in drink 1 to 3 times daily as needed for soft stools. 3. If no improvement with miralax, add Senna, 1 tablet at nighttime.
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
## 2016-08-16 NOTE — ED Notes (Signed)
ED Provider at bedside. 

## 2016-08-16 NOTE — ED Notes (Signed)
Pt c/o lower abdominal pain and constipation since surgery on Saturday.  Today she tried miralax and magnesium citrate and started passing gas.  She states she is able to hold food down, but not have a full bowel movement, just passes mucus.

## 2016-08-18 LAB — URINE CULTURE: SPECIAL REQUESTS: NORMAL

## 2016-08-28 NOTE — Addendum Note (Signed)
Addendum  created 08/28/16 1310 by Eilene GhaziGeorge Neelam Tiggs, MD   Anesthesia Intra Blocks edited, Sign clinical note

## 2016-09-24 NOTE — Telephone Encounter (Signed)
This encounter was created in error - please disregard.

## 2016-10-16 ENCOUNTER — Other Ambulatory Visit: Payer: Self-pay | Admitting: Obstetrics & Gynecology

## 2016-10-16 DIAGNOSIS — N6453 Retraction of nipple: Secondary | ICD-10-CM

## 2016-10-21 ENCOUNTER — Other Ambulatory Visit: Payer: Self-pay | Admitting: Family Medicine

## 2016-10-21 ENCOUNTER — Ambulatory Visit
Admission: RE | Admit: 2016-10-21 | Discharge: 2016-10-21 | Disposition: A | Discharge status: Home / Self Care | Payer: Medicare Other | Source: Ambulatory Visit | Attending: Obstetrics & Gynecology | Admitting: Obstetrics & Gynecology

## 2016-10-21 DIAGNOSIS — N6453 Retraction of nipple: Secondary | ICD-10-CM

## 2016-10-21 DIAGNOSIS — Z1231 Encounter for screening mammogram for malignant neoplasm of breast: Secondary | ICD-10-CM

## 2017-03-17 ENCOUNTER — Ambulatory Visit
Admission: RE | Admit: 2017-03-17 | Discharge: 2017-03-17 | Disposition: A | Discharge status: Home / Self Care | Payer: Medicare Other | Source: Ambulatory Visit | Attending: Family Medicine | Admitting: Family Medicine

## 2017-03-17 DIAGNOSIS — Z1231 Encounter for screening mammogram for malignant neoplasm of breast: Secondary | ICD-10-CM
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
## 2018-02-10 ENCOUNTER — Other Ambulatory Visit: Payer: Self-pay | Admitting: Family Medicine

## 2018-02-10 DIAGNOSIS — Z1231 Encounter for screening mammogram for malignant neoplasm of breast: Secondary | ICD-10-CM

## 2018-03-19 ENCOUNTER — Ambulatory Visit
Admission: RE | Admit: 2018-03-19 | Discharge: 2018-03-19 | Disposition: A | Discharge status: Home / Self Care | Payer: Medicare Other | Source: Ambulatory Visit | Attending: Family Medicine | Admitting: Family Medicine

## 2018-03-19 DIAGNOSIS — Z1231 Encounter for screening mammogram for malignant neoplasm of breast: Secondary | ICD-10-CM

## 2018-08-14 ENCOUNTER — Ambulatory Visit (INDEPENDENT_AMBULATORY_CARE_PROVIDER_SITE_OTHER): Payer: Medicare Other | Admitting: Cardiology

## 2018-08-14 ENCOUNTER — Encounter: Payer: Self-pay | Admitting: Cardiology

## 2018-08-14 VITALS — BP 138/78 | HR 75 | Ht 64.0 in | Wt 148.4 lb

## 2018-08-14 DIAGNOSIS — R079 Chest pain, unspecified: Secondary | ICD-10-CM

## 2018-08-14 DIAGNOSIS — I493 Ventricular premature depolarization: Secondary | ICD-10-CM | POA: Diagnosis not present

## 2018-08-14 NOTE — Patient Instructions (Signed)
Medication Instructions:  Your physician recommends that you continue on your current medications as directed. Please refer to the Current Medication list given to you today.  If you need a refill on your cardiac medications before your next appointment, please call your pharmacy.   Lab work: None  If you have labs (blood work) drawn today and your tests are completely normal, you will receive your results only by: Marland Kitchen. MyChart Message (if you have MyChart) OR . A paper copy in the mail If you have any lab test that is abnormal or we need to change your treatment, we will call you to review the results.  Testing/Procedures: You had an EKG today.   Your physician has requested that you have a stress echocardiogram. For further information please visit [email or web address]. Please follow instruction sheet as given.  Follow-Up: At [facility], you and your health needs are our priority.  As part of our continuing mission to provide you with exceptional heart care, we have created designated Provider Care Teams.  These Care Teams include your primary Cardiologist (physician) and Advanced Practice Providers (APPs -  Physician Assistants and Nurse Practitioners) who all work together to provide you with the care you need, when you need it. You will need a follow up appointment as needed with Dr. Dulce SellarMunley if symptoms worsen or fail to improve.      Exercise Stress Electrocardiogram An exercise stress electrocardiogram is a test to check how blood flows to your heart. It is done to find areas of poor blood flow. You will need to walk on a treadmill for this test. The electrocardiogram will record your heartbeat when you are at rest and when you are exercising. What happens before the procedure?  Do not have drinks with caffeine or foods with caffeine for 24 hours before the test, or as told by your doctor. This includes coffee, tea (even decaf tea), sodas, chocolate, and cocoa.  Follow your  doctor's instructions about eating and drinking before the test.  Ask your doctor what medicines you should or should not take before the test. Take your medicines with water unless told by your doctor not to.  If you use an inhaler, bring it with you to the test.  Bring a snack to eat after the test.  Do not  smoke for 4 hours before the test.  Do not put lotions, powders, creams, or oils on your chest before the test.  Wear comfortable shoes and clothing. What happens during the procedure?  You will have patches put on your chest. Small areas of your chest may need to be shaved. Wires will be connected to the patches.  Your heart rate will be watched while you are resting and while you are exercising.  You will walk on the treadmill. The treadmill will slowly get faster to raise your heart rate.  The test will take about 1-2 hours. What happens after the procedure?  Your heart rate and blood pressure will be watched after the test.  You may return to your normal diet, activities, and medicines or as told by your doctor. This information is not intended to replace advice given to you by your health care provider. Make sure you discuss any questions you have with your health care provider. Document Released: 02/26/2008 Document Revised: 05/08/2016 Document Reviewed: 05/17/2013 Elsevier Interactive Patient Education  [employer]2018 Elsevier Inc.

## 2018-08-14 NOTE — Progress Notes (Signed)
Cardiology Office Note:    Date:  08/14/2018   ID:  Brianna Horne, DOB 06/15/1946, MRN [id number]  PCP:  Merri Brunette, MD  Cardiologist:  Norman Herrlich, MD   Referring MD: Merri Brunette, MD  ASSESSMENT:    1. Chest pain, unspecified type   2. PVC (premature ventricular contraction)    PLAN:    In order of problems listed above:  1. Her symptoms are vague but concerning she will undergo a stress echo and I think it will reassure both of those.  I suspect stress and anxiety is the predominant problem and she will review with her PCP office visit in January if she is unimproved 2. Stable asymptomatic  Next appointment as needed   Medication Adjustments/Labs and Tests Ordered: Current medicines are reviewed at length with the patient today.  Concerns regarding medicines are outlined above.  Orders Placed This Encounter  Procedures  . EKG 12-Lead  . ECHOCARDIOGRAM STRESS TEST   No orders of the defined types were placed in this encounter.    Chief Complaint  Patient presents with  . Chest Pain  As needed follow-up  History of Present Illness:    Brianna Horne is a 72 y.o. female who is being seen today for the evaluation of SOB at the request of Merri Brunette, MD. She previously been seen by Dr. Donnie Aho in 2012 palpitation showed the presence of infrequent ventricular premature contractions that time a recommendation was to reduce caffeine and did not initiate treatment with the beta-blocker. Record review shows CTA of the chest 08/10/2016 with biapical pleural parenchymal scarring dependent atelectasis and scattered small 2 mm subpleural nodules.  At the same time there was coronary artery calcification.  She also had an emergency room visit 2017 with chest pain normal EKG and troponin and discharge her follow-up with her PCP  She is concerned whether or not she has heart disease she is under intense personal stress her son has battled addiction his entire life and  presently is incarcerated and she finds her self overwhelmed by the circumstances.  Associated this when she finds her self particularly under stress she gets tightness in the chest and radiates up into her jaw.  Paradoxically she While exercising she has no exertional chest pain dyspnea palpitation or syncope but she just wants to be reassured reassured that she is healthy enough for the situation occurring in her family.  She does not feel depressed and she has plans to discuss the situation with her primary care physician.  She is having no shortness of breath or syncope. Past Medical History:  Diagnosis Date  . WUJWJXBJ(478.2)     Past Surgical History:  Procedure Laterality Date  . ABDOMINAL HYSTERECTOMY    . BREAST EXCISIONAL BIOPSY Bilateral   . ORIF RADIAL FRACTURE Right 08/11/2016   Procedure: OPEN REDUCTION INTERNAL FIXATION (ORIF) RADIAL FRACTURE, OPEN REDUCTION INTERNAL FIXATION ULNA;  Surgeon: Dairl Ponder, MD;  Location: [facility] OR;  Service: Orthopedics;  Laterality: Right;    Current Medications: Current Meds  Medication Sig  . Biotin 5000 MCG SUBL Place 5,000 mcg under the tongue daily.  . cetirizine (ZYRTEC) 10 MG tablet Take 10 mg by mouth daily.  . Cholecalciferol (VITAMIN D) 2000 units tablet Take 4,000 Units by mouth daily.   . Cyanocobalamin (VITAMIN B-12 PO) Take 1 tablet by mouth daily.  . Denosumab (PROLIA [place]) Inject into the skin See admin instructions. Administered by Dr. [facility] once every 6 months - last  injection 07/25/16  . docusate sodium (COLACE) 100 MG capsule Take 100 mg by mouth 2 (two) times daily.  Marland Kitchen. ibuprofen (ADVIL,MOTRIN) 200 MG tablet Take 400 mg by mouth every 6 (six) hours as needed for headache (pain).   . Multiple Vitamins-Calcium (VIACTIV MULTI-VITAMIN) CHEW Chew 1 tablet by mouth 2 (two) times daily with a meal.  . Polyethyl Glycol-Propyl Glycol (SYSTANE OP) Place 1 drop into both eyes daily as needed (dry eyes).  Marland Kitchen.  PRESCRIPTION MEDICATION Apply 1 application topically daily as needed (psoriasis in ears). Psoriasis cream - samples from MD office  . Psyllium (METAMUCIL PO) Take 3 capsules by mouth daily.     Allergies:   Codeine; Lisinopril; Morphine and related; Oxycodone hcl; and Reglan [metoclopramide]   Social History   Socioeconomic History  . Marital status: Widowed    Spouse name: Not on file  . Number of children: Not on file  . Years of education: Not on file  . Highest education level: Not on file  Occupational History  . Not on file  Social Needs  . Financial resource strain: Not on file  . Food insecurity:    Worry: Not on file    Inability: Not on file  . Transportation needs:    Medical: Not on file    Non-medical: Not on file  Tobacco Use  . Smoking status: Former Smoker    Last attempt to quit: 02/10/1990    Years since quitting: 28.5  . Smokeless tobacco: Never Used  Substance and Sexual Activity  . Alcohol use: Yes    Alcohol/week: 7.0 standard drinks    Types: 7 Glasses of wine per week    Comment: wine  . Drug use: No  . Sexual activity: Not on file  Lifestyle  . Physical activity:    Days per week: Not on file    Minutes per session: Not on file  . Stress: Not on file  Relationships  . Social connections:    Talks on phone: Not on file    Gets together: Not on file    Attends religious service: Not on file    Active member of club or organization: Not on file    Attends meetings of clubs or organizations: Not on file    Relationship status: Not on file  Other Topics Concern  . Not on file  Social History Narrative  . Not on file     Family History: The patient's family history includes Breast cancer in her maternal aunt; Heart attack in her mother; Lung cancer in her father.  ROS:   Review of Systems  Constitution: Positive for malaise/fatigue.  HENT: Negative.   Eyes: Negative.   Cardiovascular: Positive for chest pain.  Respiratory: Negative.     Endocrine: Negative.   Hematologic/Lymphatic: Negative.   Skin: Negative.   Musculoskeletal: Negative.   Gastrointestinal: Negative.   Genitourinary: Negative.   Neurological: Negative.   Psychiatric/Behavioral: The patient is nervous/anxious.   Allergic/Immunologic: Negative.    Please see the history of present illness.     All other systems reviewed and are negative.  EKGs/Labs/Other Studies Reviewed:    The following studies were reviewed today:   EKG:  EKG is  ordered today.  [facility] and normal  Recent Labs: No results found for requested labs within last 8760 hours.  Recent Lipid Panel No results found for: Horne, TRIG, HDL, CHOLHDL, VLDL, LDLCALC, LDLDIRECT  Physical Exam:    VS:  BP  138/78 (BP Location: Right Arm, Patient Position: Sitting, Cuff Size: Normal)   Pulse 75   Ht 5\' 4"  (1.626 m)   Wt 148 lb 6.4 oz (67.3 kg)   SpO2 98%   BMI 25.47 kg/m     Wt Readings from Last 3 Encounters:  08/14/18 148 lb 6.4 oz (67.3 kg)  02/10/13 138 lb (62.6 kg)     GEN:  Well nourished, well developed in no acute distress HEENT: Normal NECK: No JVD; No carotid bruits LYMPHATICS: No lymphadenopathy CARDIAC: RRR, no murmurs, rubs, gallops RESPIRATORY:  Clear to auscultation without rales, wheezing or rhonchi  ABDOMEN: Soft, non-tender, non-distended MUSCULOSKELETAL:  No edema; No deformity  SKIN: Warm and dry NEUROLOGIC:  Alert and oriented x 3 PSYCHIATRIC:  Normal affect     Signed, Norman Herrlich, MD  08/14/2018 5:11 PM    [facility]

## 2018-08-27 ENCOUNTER — Encounter (HOSPITAL_BASED_OUTPATIENT_CLINIC_OR_DEPARTMENT_OTHER): Payer: Self-pay

## 2018-08-27 ENCOUNTER — Telehealth: Payer: Self-pay

## 2018-08-27 ENCOUNTER — Ambulatory Visit (HOSPITAL_BASED_OUTPATIENT_CLINIC_OR_DEPARTMENT_OTHER): Admission: RE | Admit: 2018-08-27 | Payer: Medicare Other | Source: Ambulatory Visit

## 2018-08-27 MED ORDER — TELMISARTAN-HCTZ 40-12.5 MG PO TABS: 1.0000 | ORAL_TABLET | Freq: Every day | ORAL | 1 refills | Status: DC

## 2018-08-27 NOTE — Telephone Encounter (Signed)
Patient was scheduled for stress echo today; however, it was not completed due to patients high blood pressure.  Patient states that she has been under a lot of personal stress lately and has noticed it elevated at her PCP previously. BP today was 190/91 sitting and 181/95 standing. Per Dr. Tomie Chinaevankar test was not performed today.   Patient returned home and checked BP at  1:00pm and was 166/83.  Later on she rechecked and was 155/87.  Dr Dulce SellarMunley is aware of patients readings.   Dr Dulce SellarMunley advised patient to start telmisartan-hctz 40-12.5mg  one tablet daily.  She will record home BP readings twice daily and bring readings into office next week at appointment with Dr Dulce SellarMunley on 09-03-18 at 4:20. Rx sent to pharmacy.  Patient agreed to plan and verbalized understanding.

## 2018-09-02 NOTE — Progress Notes (Signed)
Cardiology Office Note:    Date:  09/03/2018   ID:  Brianna Horne, DOB June 15, 1946, MRN [id number]  PCP:  Merri Brunette, MD  Cardiologist:  Norman Herrlich, MD    Referring MD: Merri Brunette, MD    ASSESSMENT:    1. Essential hypertension    PLAN:    In order of problems listed above:  1. Much improved blood pressure today in the office repeat by me 142/80 Home blood pressures are in range in retrospect I think her predominant problem is anxiety related to her son and uncontrolled hypertension we discussed rescheduling an exercise test as a screen for CAD both of [place] feel comfortable she is asymptomatic of deferring at this time.  Continue her current antihypertensives and I will see back in the office in the future as needed.  I did ask her to let us check her renal function and potassium on an ARB diuretic combination.   Next appointment: As needed   Medication Adjustments/Labs and Tests Ordered: Current medicines are reviewed at length with the patient today.  Concerns regarding medicines are outlined above.  Orders Placed This Encounter  Procedures  . Basic metabolic panel   No orders of the defined types were placed in this encounter.   No chief complaint on file.   History of Present Illness:    Brianna Horne is a 72 y.o. female with a hx of exertional SOB last seen 08/14/18. Compliance with diet, lifestyle and medications: Yes  She came for have a stress test and was severely hypertensive we will start her on treatment ARB thiazide home blood pressures are consistently 1 10-1 30 systolic and she feels much improved and her shortness of breath is resolved.  In retrospect I think her predominant problem is hypertension we decided to defer stress testing continue her current antihypertensive medication and check renal function and potassium today.  I will see back in the office as needed Past Medical History:  Diagnosis Date  . Headache(784.0)   . Hypertension      Past Surgical History:  Procedure Laterality Date  . ABDOMINAL HYSTERECTOMY    . BREAST EXCISIONAL BIOPSY Bilateral   . ORIF RADIAL FRACTURE Right 08/11/2016   Procedure: OPEN REDUCTION INTERNAL FIXATION (ORIF) RADIAL FRACTURE, OPEN REDUCTION INTERNAL FIXATION ULNA;  Surgeon: Dairl Ponder, MD;  Location: [facility] OR;  Service: Orthopedics;  Laterality: Right;    Current Medications: Current Meds  Medication Sig  . Biotin 5000 MCG SUBL Place 5,000 mcg under the tongue daily.  . cetirizine (ZYRTEC) 10 MG tablet Take 10 mg by mouth daily.  . Cholecalciferol (VITAMIN D) 2000 units tablet Take 4,000 Units by mouth daily.   . Cyanocobalamin (VITAMIN B-12 PO) Take 1 tablet by mouth daily.  . Denosumab (PROLIA [place]) Inject into the skin See admin instructions. Administered by Dr. Hal[facility] once every 6 months - last injection 07/25/16  . docusate sodium (COLACE) 100 MG capsule Take 100 mg by mouth 2 (two) times daily.  Marland Kitchen ibuprofen (ADVIL,MOTRIN) 200 MG tablet Take 400 mg by mouth every 6 (six) hours as needed for headache (pain).   . Multiple Vitamins-Calcium (VIACTIV MULTI-VITAMIN) CHEW Chew 1 tablet by mouth daily as needed.   Bertram Gala Glycol-Propyl Glycol (SYSTANE OP) Place 1 drop into both eyes daily as needed (dry eyes).  Marland Kitchen PRESCRIPTION MEDICATION Apply 1 application topically daily as needed (psoriasis in ears). Psoriasis cream - samples from MD office  . Psyllium (METAMUCIL PO) Take 3 capsules  by mouth daily.  Marland Kitchen. telmisartan-hydrochlorothiazide (MICARDIS HCT) 40-12.5 MG tablet Take 1 tablet by mouth daily.     Allergies:   Codeine; Lisinopril; Morphine and related; Oxycodone hcl; and Reglan [metoclopramide]   Social History   Socioeconomic History  . Marital status: Widowed    Spouse name: Not on file  . Number of children: Not on file  . Years of education: Not on file  . Highest education level: Not on file  Occupational History  . Not on file  Social Needs  .  Financial resource strain: Not on file  . Food insecurity:    Worry: Not on file    Inability: Not on file  . Transportation needs:    Medical: Not on file    Non-medical: Not on file  Tobacco Use  . Smoking status: Former Smoker    Last attempt to quit: 02/10/1990    Years since quitting: 28.5  . Smokeless tobacco: Never Used  Substance and Sexual Activity  . Alcohol use: Yes    Alcohol/week: 7.0 standard drinks    Types: 7 Glasses of wine per week    Comment: wine  . Drug use: No  . Sexual activity: Not on file  Lifestyle  . Physical activity:    Days per week: Not on file    Minutes per session: Not on file  . Stress: Not on file  Relationships  . Social connections:    Talks on phone: Not on file    Gets together: Not on file    Attends religious service: Not on file    Active member of club or organization: Not on file    Attends meetings of clubs or organizations: Not on file    Relationship status: Not on file  Other Topics Concern  . Not on file  Social History Narrative  . Not on file     Family History: The patient's family history includes Breast cancer in her maternal aunt; Heart attack in her mother; Lung cancer in her father. ROS:   Please see the history of present illness.    All other systems reviewed and are negative.  EKGs/Labs/Other Studies Reviewed:    The following studies were reviewed today:  Recent Labs: No results found for requested labs within last 8760 hours.  Recent Lipid Panel No results found for: Horne, TRIG, HDL, CHOLHDL, VLDL, LDLCALC, LDLDIRECT  Physical Exam:    VS:  BP (!) 160/82 (BP Location: Right Arm, Patient Position: Sitting, Cuff Size: Normal)   Pulse 76   Ht 5\' 4"  (1.626 m)   Wt 148 lb 4 oz (67.2 kg)   SpO2 97%   BMI 25.45 kg/m     Wt Readings from Last 3 Encounters:  09/03/18 148 lb 4 oz (67.2 kg)  08/14/18 148 lb 6.4 oz (67.3 kg)  02/10/13 138 lb (62.6 kg)     GEN:  Well nourished, well developed in no  acute distress HEENT: Normal NECK: No JVD; No carotid bruits LYMPHATICS: No lymphadenopathy CARDIAC: RRR, no murmurs, rubs, gallops RESPIRATORY:  Clear to auscultation without rales, wheezing or rhonchi  ABDOMEN: Soft, non-tender, non-distended MUSCULOSKELETAL:  No edema; No deformity  SKIN: Warm and dry NEUROLOGIC:  Alert and oriented x 3 PSYCHIATRIC:  Normal affect    Signed, Norman HerrlichBrian Munley, MD  09/03/2018 4:58 PM    [facility]

## 2018-09-03 ENCOUNTER — Encounter: Payer: Self-pay | Admitting: Cardiology

## 2018-09-03 ENCOUNTER — Ambulatory Visit (INDEPENDENT_AMBULATORY_CARE_PROVIDER_SITE_OTHER): Payer: Medicare Other | Admitting: Cardiology

## 2018-09-03 ENCOUNTER — Ambulatory Visit: Payer: Medicare Other | Admitting: Cardiology

## 2018-09-03 VITALS — BP 160/82 | HR 76 | Ht 64.0 in | Wt 148.2 lb

## 2018-09-03 DIAGNOSIS — I10 Essential (primary) hypertension: Secondary | ICD-10-CM | POA: Diagnosis not present

## 2018-09-03 NOTE — Patient Instructions (Addendum)
Medication Instructions:  Your physician recommends that you continue on your current medications as directed. Please refer to the Current Medication list given to you today.  If you need a refill on your cardiac medications before your next appointment, please call your pharmacy.   Lab work: You will have BMP today  If you have labs (blood work) drawn today and your tests are completely normal, you will receive your results only by: Marland Kitchen MyChart Message (if you have MyChart) OR . A paper copy in the mail If you have any lab test that is abnormal or we need to change your treatment, we will call you to review the results.  Testing/Procedures: NONE  Follow-Up: At [facility], you and your health needs are our priority.  As part of our continuing mission to provide you with exceptional heart care, we have created designated Provider Care Teams.  These Care Teams include your primary Cardiologist (physician) and Advanced Practice Providers (APPs -  Physician Assistants and Nurse Practitioners) who all work together to provide you with the care you need, when you need it. You will need a follow up appointment if symptoms worsen or fail to improve.       DASH diet: Healthy eating to lower your blood pressure The DASH diet emphasizes portion size, eating a variety of foods and getting the right amount of nutrients. Discover how DASH can improve your health and lower your blood pressure. By [facility] Staff  DASH stands for Dietary Approaches to Stop Hypertension. The DASH diet is a lifelong approach to healthy eating that's designed to help treat or prevent high blood pressure (hypertension). The DASH diet encourages you to reduce the sodium in your diet and eat a variety of foods rich in nutrients that help lower blood pressure, such as potassium, calcium and magnesium. By following the DASH diet, you may be able to reduce your blood pressure by a few points in just two weeks. Over time,  your systolic blood pressure could drop by eight to 14 points, which can make a significant difference in your health risks. Because the DASH diet is a healthy way of eating, it offers health benefits besides just lowering blood pressure. The DASH diet is also in line with dietary recommendations to prevent osteoporosis, cancer, heart disease, stroke and diabetes. DASH diet: Sodium levels The DASH diet emphasizes vegetables, fruits and low-fat dairy foods - and moderate amounts of whole grains, fish, poultry and nuts. In addition to the standard DASH diet, there is also a lower sodium version of the diet. You can choose the version of the diet that meets your health needs: Standard DASH diet. You can consume up to 2,300 milligrams (mg) of sodium a day.  Lower sodium DASH diet. You can consume up to 1,500 mg of sodium a day. Both versions of the DASH diet aim to reduce the amount of sodium in your diet compared with what you might get in a typical American diet, which can amount to a whopping 3,400 mg of sodium a day or more. The standard DASH diet meets the recommendation from the Dietary Guidelines for Americans to keep daily sodium intake to less than 2,300 mg a day. The American Heart Association recommends 1,500 mg a day of sodium as an upper limit for all adults. If you aren't sure what sodium level is right for you, talk to your doctor. DASH diet: What to eat Both versions of the DASH diet include lots of whole grains, fruits, vegetables  and low-fat dairy products. The DASH diet also includes some fish, poultry and legumes, and encourages a small amount of nuts and seeds a few times a week.  You can eat red meat, sweets and fats in small amounts. The DASH diet is low in saturated fat, cholesterol and total fat. Here's a look at the recommended servings from each food group for the 2,000-calorie-a-day DASH diet. Grains: 6 to 8 servings a day Grains include bread, cereal, rice and pasta. Examples  of one serving of grains include 1 slice whole-wheat bread, 1 ounce dry cereal, or 1/2 cup cooked cereal, rice or pasta. Focus on whole grains because they have more fiber and nutrients than do refined grains. For instance, use brown rice instead of white rice, whole-wheat pasta instead of regular pasta and whole-grain bread instead of white bread. Look for products labeled "100 percent whole grain" or "100 percent whole wheat."  Grains are naturally low in fat. Keep them this way by avoiding butter, cream and cheese sauces. Vegetables: 4 to 5 servings a day Tomatoes, carrots, broccoli, sweet potatoes, greens and other vegetables are full of fiber, vitamins, and such minerals as potassium and magnesium. Examples of one serving include 1 cup raw leafy green vegetables or 1/2 cup cut-up raw or cooked vegetables. Don't think of vegetables only as side dishes - a hearty blend of vegetables served over brown rice or whole-wheat noodles can serve as the main dish for a meal.  Fresh and frozen vegetables are both good choices. When buying frozen and canned vegetables, choose those labeled as low sodium or without added salt.  To increase the number of servings you fit in daily, be creative. In a stir-fry, for instance, cut the amount of meat in half and double up on the vegetables. Fruits: 4 to 5 servings a day Many fruits need little preparation to become a healthy part of a meal or snack. Like vegetables, they're packed with fiber, potassium and magnesium and are typically low in fat - coconuts are an exception. Examples of one serving include one medium fruit, 1/2 cup fresh, frozen or canned fruit, or 4 ounces of juice. Have a piece of fruit with meals and one as a snack, then round out your day with a dessert of fresh fruits topped with a dollop of low-fat yogurt.  Leave on edible peels whenever possible. The peels of apples, pears and most fruits with pits add interesting texture to recipes and contain  healthy nutrients and fiber.  Remember that citrus fruits and juices, such as grapefruit, can interact with certain medications, so check with your doctor or pharmacist to see if they're OK for you.  If you choose canned fruit or juice, make sure no sugar is added. Dairy: 2 to 3 servings a day Milk, yogurt, cheese and other dairy products are major sources of calcium, vitamin D and protein. But the key is to make sure that you choose dairy products that are low fat or fat-free because otherwise they can be a major source of fat - and most of it is saturated. Examples of one serving include 1 cup skim or 1 percent milk, 1 cup low fat yogurt, or 1 1/2 ounces part-skim cheese. Low-fat or fat-free frozen yogurt can help you boost the amount of dairy products you eat while offering a sweet treat. Add fruit for a healthy twist.  If you have trouble digesting dairy products, choose lactose-free products or consider taking an over-the-counter product that contains the enzyme  lactase, which can reduce or prevent the symptoms of lactose intolerance.  Go easy on regular and even fat-free cheeses because they are typically high in sodium. Lean meat, poultry and fish: 6 servings or fewer a day Meat can be a rich source of protein, B vitamins, iron and zinc. Choose lean varieties and aim for no more than 6 ounces a day. Cutting back on your meat portion will allow room for more vegetables. Trim away skin and fat from poultry and meat and then bake, broil, grill or roast instead of frying in fat.  Eat heart-healthy fish, such as salmon, herring and tuna. These types of fish are high in omega-3 fatty acids, which can help lower your total cholesterol. Nuts, seeds and legumes: 4 to 5 servings a week Almonds, sunflower seeds, kidney beans, peas, lentils and other foods in this family are good sources of magnesium, potassium and protein. They're also full of fiber and phytochemicals, which are plant compounds that may  protect against some cancers and cardiovascular disease. Serving sizes are small and are intended to be consumed only a few times a week because these foods are high in calories. Examples of one serving include 1/3 cup nuts, 2 tablespoons seeds, or 1/2 cup cooked beans or peas.  Nuts sometimes get a bad rap because of their fat content, but they contain healthy types of fat - monounsaturated fat and omega-3 fatty acids. They're high in calories, however, so eat them in moderation. Try adding them to stir-fries, salads or cereals.  Soybean-based products, such as tofu and tempeh, can be a good alternative to meat because they contain all of the amino acids your body needs to make a complete protein, just like meat. Fats and oils: 2 to 3 servings a day Fat helps your body absorb essential vitamins and helps your body's immune system. But too much fat increases your risk of heart disease, diabetes and obesity. The DASH diet strives for a healthy balance by limiting total fat to less than 30 percent of daily calories from fat, with a focus on the healthier monounsaturated fats. Examples of one serving include 1 teaspoon soft margarine, 1 tablespoon mayonnaise or 2 tablespoons salad dressing. Saturated fat and trans fat are the main dietary culprits in increasing your risk of coronary artery disease. DASH helps keep your daily saturated fat to less than 6 percent of your total calories by limiting use of meat, butter, cheese, whole milk, cream and eggs in your diet, along with foods made from lard, solid shortenings, and palm and coconut oils.  Avoid trans fat, commonly found in such processed foods as crackers, baked goods and fried items.  Read food labels on margarine and salad dressing so that you can choose those that are lowest in saturated fat and free of trans fat. Sweets: 5 servings or fewer a week You don't have to banish sweets entirely while following the DASH diet - just go easy on them. Examples  of one serving include 1 tablespoon sugar, jelly or jam, 1/2 cup sorbet, or 1 cup lemonade. When you eat sweets, choose those that are fat-free or low-fat, such as sorbets, fruit ices, jelly beans, hard candy, graham crackers or low-fat cookies.  Artificial sweeteners such as aspartame (NutraSweet, Equal) and sucralose (Splenda) may help satisfy your sweet tooth while sparing the sugar. But remember that you still must use them sensibly. It's OK to swap a diet cola for a regular cola, but not in place of a more nutritious  beverage such as low-fat milk or even plain water.  Cut back on added sugar, which has no nutritional value but can pack on calories. DASH diet: Alcohol and caffeine Drinking too much alcohol can increase blood pressure. The Dietary Guidelines for Americans recommends that men limit alcohol to no more than two drinks a day and women to one or less. The DASH diet doesn't address caffeine consumption. The influence of caffeine on blood pressure remains unclear. But caffeine can cause your blood pressure to rise at least temporarily. If you already have high blood pressure or if you think caffeine is affecting your blood pressure, talk to your doctor about your caffeine consumption. DASH diet and weight loss While the DASH diet is not a weight-loss program, you may indeed lose unwanted pounds because it can help guide you toward healthier food choices. The DASH diet generally includes about 2,000 calories a day. If you're trying to lose weight, you may need to eat fewer calories. You may also need to adjust your serving goals based on your individual circumstances - something your health care team can help you decide. Tips to cut back on sodium The foods at the core of the DASH diet are naturally low in sodium. So just by following the DASH diet, you're likely to reduce your sodium intake. You also reduce sodium further by: Using sodium-free spices or flavorings with your food instead of  salt  Not adding salt when cooking rice, pasta or hot cereal  Rinsing canned foods to remove some of the sodium  Buying foods labeled "no salt added," "sodium-free," "low sodium" or "very low sodium" One teaspoon of table salt has 2,325 mg of sodium. When you read food labels, you may be surprised at just how much sodium some processed foods contain. Even low-fat soups, canned vegetables, ready-to-eat cereals and sliced [place] from the local deli - foods you may have considered healthy - often have lots of sodium. You may notice a difference in taste when you choose low-sodium food and beverages. If things seem too bland, gradually introduce low-sodium foods and cut back on table salt until you reach your sodium goal. That'll give your palate time to adjust. Using salt-free seasoning blends or herbs and spices may also ease the transition. It can take several weeks for your taste buds to get used to less salty foods. Putting the pieces of the DASH diet together Try these strategies to get started on the DASH diet:  Change gradually. If you now eat only one or two servings of fruits or vegetables a day, try to add a serving at lunch and one at dinner. Rather than switching to all whole grains, start by making one or two of your grain servings whole grains. Increasing fruits, vegetables and whole grains gradually can also help prevent bloating or diarrhea that may occur if you aren't used to eating a diet with lots of fiber. You can also try over-the-counter products to help reduce gas from beans and vegetables.  Reward successes and forgive slip-ups. Reward yourself with a nonfood treat for your accomplishments - rent a movie, purchase a book or get together with a friend. Everyone slips, especially when learning something new. Remember that changing your lifestyle is a long-term process. Find out what triggered your setback and then just pick up where you left off with the DASH diet.  Add physical  activity. To boost your blood pressure lowering efforts even more, consider increasing your physical activity in addition to following  the DASH diet. Combining both the DASH diet and physical activity makes it more likely that you'll reduce your blood pressure.  Get support if you need it. If you're having trouble sticking to your diet, talk to your doctor or dietitian about it. You might get some tips that will help you stick to the DASH diet. Remember, healthy eating isn't an all-or-nothing proposition. What's most important is that, on average, you eat healthier foods with plenty of variety - both to keep your diet nutritious and to avoid boredom or extremes. And with the DASH diet, you can have both.   [employer]  Tips to measure your blood pressure correctly  To determine whether you have hypertension, a medical professional will take a blood pressure reading. How you prepare for the test, the position of your arm, and other factors can change a blood pressure reading by 10% or more. That could be enough to hide high blood pressure, start you on a drug you don't really need, or lead your doctor to incorrectly adjust your medications. National and international guidelines offer specific instructions for measuring blood pressure. If a doctor, nurse, or medical assistant isn't doing it right, don't hesitate to ask him or her to get with the guidelines. Here's what you can do to ensure a correct reading: . Don't drink a caffeinated beverage or smoke during the 30 minutes before the test. . Sit quietly for five minutes before the test begins. . During the measurement, sit in a chair with your feet on the floor and your arm supported so your elbow is at about heart level. . The inflatable part of the cuff should completely cover at least 80% of your upper arm, and the cuff should be placed on bare skin, not over a shirt. . Don't talk during the measurement. . Have your blood pressure measured twice,  with a brief break in between. If the readings are different by 5 points or more, have it done a third time. There are times to break these rules. If you sometimes feel lightheaded when getting out of bed in the morning or when you stand after sitting, you should have your blood pressure checked while seated and then while standing to see if it falls from one position to the next. Because blood pressure varies throughout the day, your doctor will rarely diagnose hypertension on the basis of a single reading. Instead, he or she will want to confirm the measurements on at least two occasions, usually within a few weeks of one another. The exception to this rule is if you have a blood pressure reading of 180/110 mm Hg or higher. A result this high usually calls for prompt treatment. It's also a good idea to have your blood pressure measured in both arms at least once, since the reading in one arm (usually the right) may be higher than that in the left. A 2014 study in The American Journal of Medicine of nearly 3,400 people found average arm- to-arm differences in systolic blood pressure of about 5 points. The higher number should be used to make treatment decisions. In 2017, new guidelines from the American Heart Association, the [employer] of [employer]iology, and nine other health organizations lowered the diagnosis of high blood pressure to 130/80 mm Hg or higher for all adults. The guidelines also redefined the various blood pressure categories to now include normal, elevated, Stage 1 hypertension, Stage 2 hypertension, and hypertensive crisis (see "Blood pressure categories"). Blood pressure categories  Blood pressure  category SYSTOLIC (upper number)  DIASTOLIC (lower number)  Normal Less than 120 mm Hg and Less than 80 mm Hg  Elevated 120-129 mm Hg and Less than 80 mm Hg  High blood pressure: Stage 1 hypertension 130-139 mm Hg or 80-89 mm Hg  High blood pressure: Stage 2 hypertension 140 mm Hg or  higher or 90 mm Hg or higher  Hypertensive crisis (consult your doctor immediately) Higher than 180 mm Hg and/or Higher than 120 mm Hg  Source: American Heart Association and American Stroke Association. For more on getting your blood pressure under control, buy Controlling Your Blood Pressure, a Special Health Report from [facility].

## 2018-09-04 LAB — BASIC METABOLIC PANEL
BUN/Creatinine Ratio: 22 (ref 12–28)
BUN: 19 mg/dL (ref 8–27)
CALCIUM: 9.4 mg/dL (ref 8.7–10.3)
CHLORIDE: 102 mmol/L (ref 96–106)
CO2: 23 mmol/L (ref 20–29)
Creatinine, Ser: 0.86 mg/dL (ref 0.57–1.00)
GFR calc Af Amer: 79 mL/min/{1.73_m2} (ref >59)
GFR calc non Af Amer: 68 mL/min/{1.73_m2} (ref >59)
Glucose: 78 mg/dL (ref 65–99)
POTASSIUM: 3.7 mmol/L (ref 3.5–5.2)
Sodium: 142 mmol/L (ref 134–144)

## 2018-09-24 ENCOUNTER — Telehealth: Payer: Self-pay | Admitting: Cardiology

## 2018-09-24 MED ORDER — TELMISARTAN-HCTZ 40-12.5 MG PO TABS: 0.5000 | ORAL_TABLET | Freq: Every day | ORAL | 1 refills | Status: DC

## 2018-09-24 NOTE — Telephone Encounter (Signed)
Left message for patient to return call.

## 2018-09-24 NOTE — Telephone Encounter (Signed)
Blood pressure   Today:    106/74   Hr 88  09/23/2018  113/73 Hr 69 09/22/18   96/63  Hr 86  (felt dizzy) 09/21/18   102/75  Hr 79

## 2018-09-24 NOTE — Addendum Note (Signed)
Addended by: Lita Mains on: 09/24/2018 03:18 PM   Modules accepted: Orders

## 2018-09-24 NOTE — Telephone Encounter (Signed)
Patient informed to only take 1/2 tablet of micardis daily per Dr. Dulce Sellar. Patient advised to call back if blood pressure remains low and if dizziness remains. She verbally understands

## 2018-09-24 NOTE — Telephone Encounter (Signed)
Patient reports she was recently started on micardis 40-12.5 mg daily, and since she has had some low blood pressure readings accompanied with dizziness. She will get blood pressure readings and call me back with them. She wants to know if her dose needs to be changed before she refills. Will await her to return call.

## 2018-09-24 NOTE — Telephone Encounter (Signed)
Reduce to 1/2 day micardis

## 2018-09-24 NOTE — Telephone Encounter (Signed)
Patient called stating she was put on MICARDIS at her last appt with Dr Dulce Sellar.  She says over the last couple days,her blood pressure has been lower than usual for example one day it was 96/63 and she remembers feeling dizzy that day. She is taking her last pill today and before she gets it refilled she wants to know if she should lower the dose or do anythingdifferent .  She can be reached at [phone]

## 2018-09-28 ENCOUNTER — Other Ambulatory Visit: Payer: Self-pay | Admitting: Family Medicine

## 2018-09-28 ENCOUNTER — Ambulatory Visit
Admission: RE | Admit: 2018-09-28 | Discharge: 2018-09-28 | Disposition: A | Discharge status: Home / Self Care | Payer: Medicare Other | Source: Ambulatory Visit | Attending: Family Medicine | Admitting: Family Medicine

## 2018-09-28 DIAGNOSIS — R0781 Pleurodynia: Secondary | ICD-10-CM

## 2019-01-11 ENCOUNTER — Other Ambulatory Visit: Payer: Self-pay | Admitting: Cardiology

## 2019-01-12 ENCOUNTER — Telehealth: Payer: Self-pay | Admitting: Cardiology

## 2019-01-12 MED ORDER — TELMISARTAN-HCTZ 40-12.5 MG PO TABS: 0.5000 | ORAL_TABLET | Freq: Every day | ORAL | 1 refills | Status: DC

## 2019-01-12 NOTE — Telephone Encounter (Signed)
Patient called stating since decreasing her dose of telmisartan-hydrochlorothiazide to 1/2 tablet, her BP bottom number has been very low  Please call patient to discuss

## 2019-01-12 NOTE — Telephone Encounter (Signed)
Patient concerned that she had 1 episode of feeling woozy like she had low blood sugar when she was shopping one day. She sat down, drank a soda, ate some bread and checked her BP which was 108/53. She felt better afterward but is concerned that her diastolic BP's are lower than they used to be. Prior to this incident, BP's ran 105-113 systolic and 60's-70's diastolic. Last 3 days BP's have been 108/59, 105/53, 103/55. She has not had another incident of feeling woozy, has no edema, shortness of breath or weight gain and is taking mycardis as ordered.   pls advise, tx

## 2019-01-12 NOTE — Telephone Encounter (Signed)
Telmisartan Hctz sent to [facility] on [address]. Note to pharmacy that pt will need to get further refills from PCP

## 2019-01-12 NOTE — Telephone Encounter (Signed)
I agree she is having symptomatic hypotension we can do 1 of 2 things which ever she prefers she can take the my cordis on days where her systolics are greater than 140 or we could just take advantage of its long half-life and take it every other day.  Please ask her to keep track of blood pressures I would like to see her blood pressure predominantly in the range of 1 20-1 30 systolic

## 2019-01-12 NOTE — Addendum Note (Signed)
Addended by: Jacques Earthly A on: 01/12/2019 04:42 PM   Modules accepted: Orders

## 2019-01-12 NOTE — Telephone Encounter (Signed)
Informed patient Dr. Dulce Sellar advises taking telmisartan every other day or only taking when systolic BP is greater than 140 and that he would ideally like to see her systolic BP's in the 120's-130's range. Patient chooses to take the telmisartan every other day. She agrees to check her BP daily and call us with any further issues or concerns. [facility] refills sent to [facility] on [address] in [place] per pt preference

## 2019-01-20 ENCOUNTER — Other Ambulatory Visit: Payer: Self-pay | Admitting: Family Medicine

## 2019-01-20 DIAGNOSIS — Z1231 Encounter for screening mammogram for malignant neoplasm of breast: Secondary | ICD-10-CM

## 2019-03-22 ENCOUNTER — Ambulatory Visit
Admission: RE | Admit: 2019-03-22 | Discharge: 2019-03-22 | Disposition: A | Discharge status: Home / Self Care | Payer: Medicare Other | Source: Ambulatory Visit | Attending: Family Medicine | Admitting: Family Medicine

## 2019-03-22 ENCOUNTER — Other Ambulatory Visit: Payer: Self-pay

## 2019-03-22 DIAGNOSIS — Z1231 Encounter for screening mammogram for malignant neoplasm of breast: Secondary | ICD-10-CM

## 2019-06-29 ENCOUNTER — Other Ambulatory Visit: Payer: Self-pay

## 2019-06-29 DIAGNOSIS — Z20822 Contact with and (suspected) exposure to covid-19: Secondary | ICD-10-CM

## 2019-07-01 LAB — SPECIMEN STATUS REPORT

## 2019-07-01 LAB — NOVEL CORONAVIRUS, NAA: SARS-CoV-2, NAA: DETECTED — AB

## 2019-10-23 ENCOUNTER — Telehealth: Payer: Self-pay

## 2019-10-23 ENCOUNTER — Other Ambulatory Visit: Payer: Self-pay | Admitting: Physician Assistant

## 2019-10-23 DIAGNOSIS — R1012 Left upper quadrant pain: Secondary | ICD-10-CM

## 2019-10-23 NOTE — Telephone Encounter (Signed)
Patient states that she had message on her machine to call. She states she has no reason that she is aware of that we would be contacting her. Nothing in chart that states anyone tried to contact her. She states it is most likely an old message she did not delete.  She was advised to call her PCP on Monday to follow up for any message from them. She verbalized understanding

## 2019-10-29 ENCOUNTER — Ambulatory Visit
Admission: RE | Admit: 2019-10-29 | Discharge: 2019-10-29 | Disposition: A | Discharge status: Home / Self Care | Payer: Medicare Other | Source: Ambulatory Visit | Attending: Physician Assistant | Admitting: Physician Assistant

## 2019-10-29 DIAGNOSIS — R1012 Left upper quadrant pain: Secondary | ICD-10-CM

## 2019-11-30 DIAGNOSIS — H52203 Unspecified astigmatism, bilateral: Secondary | ICD-10-CM | POA: Diagnosis not present

## 2019-11-30 DIAGNOSIS — H5203 Hypermetropia, bilateral: Secondary | ICD-10-CM | POA: Diagnosis not present

## 2019-11-30 DIAGNOSIS — H2513 Age-related nuclear cataract, bilateral: Secondary | ICD-10-CM | POA: Diagnosis not present

## 2020-01-12 ENCOUNTER — Other Ambulatory Visit: Payer: Self-pay | Admitting: Family Medicine

## 2020-01-12 DIAGNOSIS — Z1231 Encounter for screening mammogram for malignant neoplasm of breast: Secondary | ICD-10-CM

## 2020-01-17 ENCOUNTER — Other Ambulatory Visit: Payer: Self-pay | Admitting: Family Medicine

## 2020-01-17 DIAGNOSIS — E2839 Other primary ovarian failure: Secondary | ICD-10-CM

## 2020-01-19 DIAGNOSIS — M79671 Pain in right foot: Secondary | ICD-10-CM | POA: Diagnosis not present

## 2020-01-19 DIAGNOSIS — M2021 Hallux rigidus, right foot: Secondary | ICD-10-CM | POA: Diagnosis not present

## 2020-02-07 DIAGNOSIS — M810 Age-related osteoporosis without current pathological fracture: Secondary | ICD-10-CM | POA: Diagnosis not present

## 2020-02-29 DIAGNOSIS — M545 Low back pain: Secondary | ICD-10-CM | POA: Diagnosis not present

## 2020-02-29 DIAGNOSIS — L989 Disorder of the skin and subcutaneous tissue, unspecified: Secondary | ICD-10-CM | POA: Diagnosis not present

## 2020-03-01 ENCOUNTER — Ambulatory Visit
Admission: RE | Admit: 2020-03-01 | Discharge: 2020-03-01 | Disposition: A | Discharge status: Home / Self Care | Payer: Medicare PPO | Source: Ambulatory Visit | Attending: Family Medicine | Admitting: Family Medicine

## 2020-03-01 ENCOUNTER — Other Ambulatory Visit: Payer: Self-pay | Admitting: Family Medicine

## 2020-03-01 DIAGNOSIS — M545 Low back pain, unspecified: Secondary | ICD-10-CM

## 2020-03-06 ENCOUNTER — Other Ambulatory Visit: Payer: Self-pay | Admitting: Family Medicine

## 2020-03-06 DIAGNOSIS — R222 Localized swelling, mass and lump, trunk: Secondary | ICD-10-CM

## 2020-03-08 ENCOUNTER — Ambulatory Visit
Admission: RE | Admit: 2020-03-08 | Discharge: 2020-03-08 | Disposition: A | Discharge status: Home / Self Care | Payer: Medicare PPO | Source: Ambulatory Visit | Attending: Family Medicine | Admitting: Family Medicine

## 2020-03-08 DIAGNOSIS — R222 Localized swelling, mass and lump, trunk: Secondary | ICD-10-CM

## 2020-03-31 ENCOUNTER — Ambulatory Visit
Admission: RE | Admit: 2020-03-31 | Discharge: 2020-03-31 | Disposition: A | Discharge status: Home / Self Care | Payer: Medicare PPO | Source: Ambulatory Visit | Attending: Family Medicine | Admitting: Family Medicine

## 2020-03-31 ENCOUNTER — Other Ambulatory Visit: Payer: Self-pay

## 2020-03-31 DIAGNOSIS — M8589 Other specified disorders of bone density and structure, multiple sites: Secondary | ICD-10-CM | POA: Diagnosis not present

## 2020-03-31 DIAGNOSIS — Z1231 Encounter for screening mammogram for malignant neoplasm of breast: Secondary | ICD-10-CM

## 2020-03-31 DIAGNOSIS — E2839 Other primary ovarian failure: Secondary | ICD-10-CM

## 2020-03-31 DIAGNOSIS — Z780 Asymptomatic menopausal state: Secondary | ICD-10-CM | POA: Diagnosis not present

## 2020-04-18 DIAGNOSIS — K8689 Other specified diseases of pancreas: Secondary | ICD-10-CM | POA: Diagnosis not present

## 2020-04-18 DIAGNOSIS — E782 Mixed hyperlipidemia: Secondary | ICD-10-CM | POA: Diagnosis not present

## 2020-04-18 DIAGNOSIS — I10 Essential (primary) hypertension: Secondary | ICD-10-CM | POA: Diagnosis not present

## 2020-04-18 DIAGNOSIS — M810 Age-related osteoporosis without current pathological fracture: Secondary | ICD-10-CM | POA: Diagnosis not present

## 2020-05-11 DIAGNOSIS — K8689 Other specified diseases of pancreas: Secondary | ICD-10-CM | POA: Diagnosis not present

## 2020-07-24 DIAGNOSIS — L821 Other seborrheic keratosis: Secondary | ICD-10-CM | POA: Diagnosis not present

## 2020-07-24 DIAGNOSIS — L578 Other skin changes due to chronic exposure to nonionizing radiation: Secondary | ICD-10-CM | POA: Diagnosis not present

## 2020-08-01 DIAGNOSIS — F4321 Adjustment disorder with depressed mood: Secondary | ICD-10-CM | POA: Diagnosis not present

## 2020-08-10 DIAGNOSIS — M810 Age-related osteoporosis without current pathological fracture: Secondary | ICD-10-CM | POA: Diagnosis not present

## 2020-09-26 DIAGNOSIS — L821 Other seborrheic keratosis: Secondary | ICD-10-CM | POA: Diagnosis not present

## 2020-09-26 DIAGNOSIS — L578 Other skin changes due to chronic exposure to nonionizing radiation: Secondary | ICD-10-CM | POA: Diagnosis not present

## 2020-11-28 DIAGNOSIS — M5416 Radiculopathy, lumbar region: Secondary | ICD-10-CM | POA: Diagnosis not present

## 2020-11-28 DIAGNOSIS — M5136 Other intervertebral disc degeneration, lumbar region: Secondary | ICD-10-CM | POA: Diagnosis not present

## 2020-11-30 DIAGNOSIS — H5203 Hypermetropia, bilateral: Secondary | ICD-10-CM | POA: Diagnosis not present

## 2020-11-30 DIAGNOSIS — H2513 Age-related nuclear cataract, bilateral: Secondary | ICD-10-CM | POA: Diagnosis not present

## 2020-11-30 DIAGNOSIS — H52203 Unspecified astigmatism, bilateral: Secondary | ICD-10-CM | POA: Diagnosis not present

## 2020-12-06 DIAGNOSIS — K5901 Slow transit constipation: Secondary | ICD-10-CM | POA: Diagnosis not present

## 2020-12-06 DIAGNOSIS — E782 Mixed hyperlipidemia: Secondary | ICD-10-CM | POA: Diagnosis not present

## 2020-12-06 DIAGNOSIS — I10 Essential (primary) hypertension: Secondary | ICD-10-CM | POA: Diagnosis not present

## 2020-12-06 DIAGNOSIS — Z1389 Encounter for screening for other disorder: Secondary | ICD-10-CM | POA: Diagnosis not present

## 2020-12-06 DIAGNOSIS — Z0000 Encounter for general adult medical examination without abnormal findings: Secondary | ICD-10-CM | POA: Diagnosis not present

## 2020-12-06 DIAGNOSIS — K2100 Gastro-esophageal reflux disease with esophagitis, without bleeding: Secondary | ICD-10-CM | POA: Diagnosis not present

## 2020-12-06 DIAGNOSIS — N6331 Unspecified lump in axillary tail of the right breast: Secondary | ICD-10-CM | POA: Diagnosis not present

## 2020-12-07 DIAGNOSIS — M545 Low back pain, unspecified: Secondary | ICD-10-CM | POA: Diagnosis not present

## 2020-12-12 ENCOUNTER — Other Ambulatory Visit: Payer: Self-pay | Admitting: Family Medicine

## 2020-12-12 DIAGNOSIS — R2231 Localized swelling, mass and lump, right upper limb: Secondary | ICD-10-CM

## 2020-12-14 DIAGNOSIS — M545 Low back pain, unspecified: Secondary | ICD-10-CM | POA: Diagnosis not present

## 2020-12-20 DIAGNOSIS — M545 Low back pain, unspecified: Secondary | ICD-10-CM | POA: Diagnosis not present

## 2020-12-22 DIAGNOSIS — M545 Low back pain, unspecified: Secondary | ICD-10-CM | POA: Diagnosis not present

## 2020-12-26 DIAGNOSIS — M545 Low back pain, unspecified: Secondary | ICD-10-CM | POA: Diagnosis not present

## 2020-12-28 DIAGNOSIS — M545 Low back pain, unspecified: Secondary | ICD-10-CM | POA: Diagnosis not present

## 2021-01-02 DIAGNOSIS — M545 Low back pain, unspecified: Secondary | ICD-10-CM | POA: Diagnosis not present

## 2021-01-04 DIAGNOSIS — M545 Low back pain, unspecified: Secondary | ICD-10-CM | POA: Diagnosis not present

## 2021-01-17 DIAGNOSIS — N6331 Unspecified lump in axillary tail of the right breast: Secondary | ICD-10-CM | POA: Diagnosis not present

## 2021-01-17 DIAGNOSIS — N6311 Unspecified lump in the right breast, upper outer quadrant: Secondary | ICD-10-CM | POA: Diagnosis not present

## 2021-01-22 ENCOUNTER — Other Ambulatory Visit: Payer: Self-pay | Admitting: Radiology

## 2021-01-22 DIAGNOSIS — Z1231 Encounter for screening mammogram for malignant neoplasm of breast: Secondary | ICD-10-CM

## 2021-01-23 ENCOUNTER — Other Ambulatory Visit: Payer: Medicare PPO

## 2021-02-14 DIAGNOSIS — M810 Age-related osteoporosis without current pathological fracture: Secondary | ICD-10-CM | POA: Diagnosis not present

## 2021-04-04 DIAGNOSIS — L819 Disorder of pigmentation, unspecified: Secondary | ICD-10-CM | POA: Diagnosis not present

## 2021-04-04 DIAGNOSIS — L821 Other seborrheic keratosis: Secondary | ICD-10-CM | POA: Diagnosis not present

## 2021-04-04 DIAGNOSIS — L578 Other skin changes due to chronic exposure to nonionizing radiation: Secondary | ICD-10-CM | POA: Diagnosis not present

## 2021-04-04 DIAGNOSIS — L814 Other melanin hyperpigmentation: Secondary | ICD-10-CM | POA: Diagnosis not present

## 2021-04-04 DIAGNOSIS — L438 Other lichen planus: Secondary | ICD-10-CM | POA: Diagnosis not present

## 2021-04-04 DIAGNOSIS — L570 Actinic keratosis: Secondary | ICD-10-CM | POA: Diagnosis not present

## 2021-04-16 ENCOUNTER — Telehealth: Payer: Self-pay | Admitting: Cardiology

## 2021-04-16 NOTE — Telephone Encounter (Signed)
Patient is requesting an order for a stress test. She was last seen on 09/03/18, so I also scheduled her for a follow up visit with Dr. Dulce Sellar on 05/25/21.

## 2021-05-02 ENCOUNTER — Other Ambulatory Visit: Payer: Self-pay

## 2021-05-02 ENCOUNTER — Ambulatory Visit
Admission: RE | Admit: 2021-05-02 | Discharge: 2021-05-02 | Disposition: A | Discharge status: Home / Self Care | Payer: Medicare PPO | Source: Ambulatory Visit | Attending: Family Medicine | Admitting: Family Medicine

## 2021-05-02 DIAGNOSIS — Z1231 Encounter for screening mammogram for malignant neoplasm of breast: Secondary | ICD-10-CM | POA: Diagnosis not present

## 2021-05-21 DIAGNOSIS — I10 Essential (primary) hypertension: Secondary | ICD-10-CM | POA: Insufficient documentation

## 2021-05-21 DIAGNOSIS — M542 Cervicalgia: Secondary | ICD-10-CM | POA: Diagnosis not present

## 2021-05-21 DIAGNOSIS — M5030 Other cervical disc degeneration, unspecified cervical region: Secondary | ICD-10-CM | POA: Diagnosis not present

## 2021-05-24 NOTE — Progress Notes (Signed)
Cardiology Office Note:    Date:  05/25/2021   ID:  Brianna Horne, DOB [birth year], MRN [id number]  PCP:  Merri Brunette, MD  Cardiologist:  Norman Herrlich, MD    Referring MD: Merri Brunette, MD    ASSESSMENT:    1. Shortness of breath   2. Primary hypertension   3. Chest pain of uncertain etiology    PLAN:    In order of problems listed above:  Previously was seen by me there was concern that her exertional shortness of breath was equivalent, unfortunately she never had ischemia evaluation her symptoms have progressed she has marked exercise tolerance but unfortunately is no longer able to exercise on the treadmill because of foot problems.  Her symptoms appear to be anginal equivalent I think she is best served by cardiac CTA that will be scheduled as an outpatient but I do not think functional pharmacologic perfusion imaging will be helpful in this case.  I am Stable hypertension BP at target she will continue her current medical therapy ARB Stable hyperlipidemia continue statin   Next appointment: 3 months   Medication Adjustments/Labs and Tests Ordered: Current medicines are reviewed at length with the patient today.  Concerns regarding medicines are outlined above.  Orders Placed This Encounter  Procedures   CT CORONARY MORPH W/CTA COR W/SCORE W/CA W/CM &/OR WO/CM   Basic metabolic panel   Pro b natriuretic peptide (BNP)   EKG 12-Lead   Meds ordered this encounter  Medications   metoprolol tartrate (LOPRESSOR) 100 MG tablet    Sig: Take 1 tablet (100 mg total) by mouth once for 1 dose. Take 2 hours prior to your CT if your heart rate is greater than 55    Dispense:  1 tablet    Refill:  0    Chief complaint exercise intolerance and exertional shortness of breath is worsened   History of Present Illness:    Brianna Horne is a 75 y.o. female with a hx of hypertension and hyperlipidemia ast seen 09/03/2018.  At that time she had exertional shortness of breath  and declined having ischemia evaluation performed..  Compliance with diet, lifestyle and medications: Yes  After her last visit she came in for stress echo Canceled because her blood pressure was elevated and never rescheduled. She is not doing well she has developed exercise intolerance exertional shortness of breath used to walk several miles a day and cannot do it now also on the basis of a blood foot.  She has had no chest pain but she also feels weak and fatigued and is concerned about the potential underlying heart disease.  In her case I do think functional testing would be beneficial for concerned that her exercise intolerance and exertional shortness of breath or anginal equivalent and after discussion feel that a cardiac CTA is the best choice.  No history of dye allergy and no kidney disease.  She does have edema orthopnea cough or wheezing.  No known history of lung disease  She had a CT of her chest performed 2017 November showed bilateral parenchymal scar.  There are a few scattered small 2 mm nodules there is aortic atherosclerosis noted heart size was normal no pericardial effusion. Past Medical History:  Diagnosis Date   Headache(784.0)    Hypertension     Past Surgical History:  Procedure Laterality Date   ABDOMINAL HYSTERECTOMY     BREAST EXCISIONAL BIOPSY Bilateral    ORIF RADIAL FRACTURE Right 08/11/2016   Procedure:  OPEN REDUCTION INTERNAL FIXATION (ORIF) RADIAL FRACTURE, OPEN REDUCTION INTERNAL FIXATION ULNA;  Surgeon: Dairl Ponder, MD;  Location: [facility] OR;  Service: Orthopedics;  Laterality: Right;    Current Medications: Current Meds  Medication Sig   Biotin 5000 MCG SUBL Place 5,000 mcg under the tongue daily.   Carboxymethylcellulose Sodium (EYE DROPS OP) Place 1 drop into both eyes daily. Bio Tru   cetirizine (ZYRTEC) 10 MG tablet Take 10 mg by mouth daily.   Cholecalciferol (VITAMIN D) 2000 units tablet Take 4,000 Units by mouth daily.    Cyanocobalamin  (VITAMIN B-12 PO) Take 1 tablet by mouth daily.   Denosumab (PROLIA [place]) Inject into the skin See admin instructions. Administered by Dr. Hal[facility] once every 6 months - last injection 07/25/16   docusate sodium (COLACE) 100 MG capsule Take 100 mg by mouth 2 (two) times daily.   losartan (COZAAR) 25 MG tablet Take 25 mg by mouth daily.   metoprolol tartrate (LOPRESSOR) 100 MG tablet Take 1 tablet (100 mg total) by mouth once for 1 dose. Take 2 hours prior to your CT if your heart rate is greater than 55   Polyethyl Glycol-Propyl Glycol (SYSTANE OP) Place 1 drop into both eyes daily as needed (dry eyes).   Psyllium (METAMUCIL PO) Take 3 capsules by mouth daily.   rosuvastatin (CRESTOR) 10 MG tablet Take 10 mg by mouth daily.     Allergies:   Codeine, Lisinopril, Morphine and related, Oxycodone hcl, and Reglan [metoclopramide]   Social History   Socioeconomic History   Marital status: Widowed    Spouse name: Not on file   Number of children: Not on file   Years of education: Not on file   Highest education level: Not on file  Occupational History   Not on file  Tobacco Use   Smoking status: Former    Types: Cigarettes    Quit date: 02/10/1990    Years since quitting: 31.3   Smokeless tobacco: Never  Vaping Use   Vaping Use: Never used  Substance and Sexual Activity   Alcohol use: Yes    Alcohol/week: 7.0 standard drinks    Types: 7 Glasses of wine per week    Comment: wine   Drug use: No   Sexual activity: Not on file  Other Topics Concern   Not on file  Social History Narrative   Not on file   Social Determinants of Health   Financial Resource Strain: Not on file  Food Insecurity: Not on file  Transportation Needs: Not on file  Physical Activity: Not on file  Stress: Not on file  Social Connections: Not on file     Family History: The patient's family history includes Breast cancer in her maternal aunt; Heart attack in her mother; Lung cancer in her  father. ROS:   Please see the history of present illness.    All other systems reviewed and are negative.  EKGs/Labs/Other Studies Reviewed:    The following studies were reviewed today:  EKG:  EKG ordered today and personally reviewed.  The ekg ordered today demonstrates sinus rhythm normal EKG  Recent Labs: No results found for requested labs within last 8760 hours.  Recent Lipid Panel No results found for: CHOL, TRIG, HDL, CHOLHDL, VLDL, LDLCALC, LDLDIRECT  Physical Exam:    VS:  BP 128/70 (BP Location: Right Arm, Patient Position: Sitting, Cuff Size: Normal)   Pulse 74   Ht 5\' 4"  (1.626 m)   Wt 147 lb 1.9  oz (66.7 kg)   SpO2 98%   BMI 25.25 kg/m     Wt Readings from Last 3 Encounters:  05/25/21 147 lb 1.9 oz (66.7 kg)  09/03/18 148 lb 4 oz (67.2 kg)  08/14/18 148 lb 6.4 oz (67.3 kg)     GEN:  Well nourished, well developed in no acute distress HEENT: Normal NECK: No JVD; No carotid bruits LYMPHATICS: No lymphadenopathy CARDIAC: RRR, no murmurs, rubs, gallops RESPIRATORY:  Clear to auscultation without rales, wheezing or rhonchi  ABDOMEN: Soft, non-tender, non-distended MUSCULOSKELETAL:  No edema; No deformity  SKIN: Warm and dry NEUROLOGIC:  Alert and oriented x 3 PSYCHIATRIC:  Normal affect    Signed, Norman Herrlich, MD  05/25/2021 12:20 PM    [facility]

## 2021-05-25 ENCOUNTER — Other Ambulatory Visit: Payer: Self-pay

## 2021-05-25 ENCOUNTER — Ambulatory Visit: Payer: Medicare PPO | Admitting: Cardiology

## 2021-05-25 ENCOUNTER — Encounter: Payer: Self-pay | Admitting: Cardiology

## 2021-05-25 VITALS — BP 128/70 | HR 74 | Ht 64.0 in | Wt 147.1 lb

## 2021-05-25 DIAGNOSIS — R0602 Shortness of breath: Secondary | ICD-10-CM | POA: Diagnosis not present

## 2021-05-25 DIAGNOSIS — R079 Chest pain, unspecified: Secondary | ICD-10-CM

## 2021-05-25 DIAGNOSIS — I10 Essential (primary) hypertension: Secondary | ICD-10-CM | POA: Diagnosis not present

## 2021-05-25 DIAGNOSIS — E782 Mixed hyperlipidemia: Secondary | ICD-10-CM

## 2021-05-25 MED ORDER — METOPROLOL TARTRATE 100 MG PO TABS: 100.0000 mg | ORAL_TABLET | Freq: Once | ORAL | 0 refills | Status: DC

## 2021-05-25 NOTE — Patient Instructions (Signed)
Medication Instructions:  No medication changes. *If you need a refill on your cardiac medications before your next appointment, please call your pharmacy*   Lab Work: Your physician recommends that you have a BMET and ProBNP today in the office.   If you have labs (blood work) drawn today and your tests are completely normal, you will receive your results only by: [place] (if you have MyChart) OR A paper copy in the mail If you have any lab test that is abnormal or we need to change your treatment, we will call you to review the results.   Testing/Procedures: Your cardiac CT will be scheduled at:   [facility] [place], [place] [address] [phone]   If scheduled at [facility], please arrive at the [facility] main entrance of [facility] 30 minutes prior to test start time. Proceed to the [facility] Radiology Department (first floor) to check-in and test prep.  Please follow these instructions carefully (unless otherwise directed):  On the Night Before the Test: Be sure to Drink plenty of water. Do not consume any caffeinated/decaffeinated beverages or chocolate 12 hours prior to your test. Do not take any antihistamines 12 hours prior to your test.   On the Day of the Test: Drink plenty of water. Do not drink any water within one hour of the test. Do not eat any food 4 hours prior to the test. You may take your regular medications prior to the test.  Take metoprolol (Lopressor) two hours prior to test. FEMALES- please wear underwire-free bra if available       After the Test: Drink plenty of water. After receiving IV contrast, you may experience a mild flushed feeling. This is normal. On occasion, you may experience a mild rash up to 24 hours after the test. This is not dangerous. If this occurs, you can take Benadryl 25 mg and increase your fluid intake. If you experience trouble breathing, this can be serious.  If it is severe call 911 IMMEDIATELY. If it is mild, please call our office.    Once we have confirmed authorization from your insurance company, we will call you to set up a date and time for your test. Based on how quickly your insurance processes prior authorizations requests, please allow up to 4 weeks to be contacted for scheduling your Cardiac CT appointment. Be advised that routine Cardiac CT appointments could be scheduled as many as 8 weeks after your provider has ordered it.  For non-scheduling related questions, please contact the cardiac imaging nurse navigator should you have any questions/concerns: Marchia Bond, Cardiac Imaging Nurse Navigator Burley Saver, Interim Cardiac Imaging Nurse [place] [facility] Direct Office Dial: [phone]   For scheduling needs, including cancellations and rescheduling, please call Vivien Rota at [phone].     Follow-Up: At [facility], you and your health needs are our priority.  As part of our continuing mission to provide you with exceptional heart care, we have created designated Provider Care Teams.  These Care Teams include your primary Cardiologist (physician) and Advanced Practice Providers (APPs -  Physician Assistants and Nurse Practitioners) who all work together to provide you with the care you need, when you need it.  We recommend signing up for the patient portal called "MyChart".  Sign up information is provided on this After Visit Summary.  MyChart is used to connect with patients for Virtual Visits (Telemedicine).  Patients are able to view lab/test  results, encounter notes, upcoming appointments, etc.  Non-urgent messages can be sent to your provider as well.   To learn more about what you can do with MyChart, go to [email or web address].    Your next appointment:   3 month(s)  The format for your next appointment:   In Person  Provider:   Shirlee More, MD   Other Instructions Cardiac CT  Angiogram A cardiac CT angiogram is a procedure to look at the heart and the area around the heart. It may be done to help find the cause of chest pains or other symptoms of heart disease. During this procedure, a substance called contrast dye is injected into the blood vessels in the area to be checked. A large X-ray machine, called a CT scanner, then takes detailed pictures of the heart and the surrounding area. The procedure is also sometimes called a coronary CT angiogram, coronary artery scanning, or CTA. A cardiac CT angiogram allows the health care provider to see how well blood is flowing to and from the heart. The health care provider will be able to see if there are any problems, such as: Blockage or narrowing of the coronary arteries in the heart. Fluid around the heart. Signs of weakness or disease in the muscles, valves, and tissues of the heart. Tell a health care provider about: Any allergies you have. This is especially important if you have had a previous allergic reaction to contrast dye. All medicines you are taking, including vitamins, herbs, eye drops, creams, and over-the-counter medicines. Any blood disorders you have. Any surgeries you have had. Any medical conditions you have. Whether you are pregnant or may be pregnant. Any anxiety disorders, chronic pain, or other conditions you have that may increase your stress or prevent you from lying still. What are the risks? Generally, this is a safe procedure. However, problems may occur, including: Bleeding. Infection. Allergic reactions to medicines or dyes. Damage to other structures or organs. Kidney damage from the contrast dye that is used. Increased risk of cancer from radiation exposure. This risk is low. Talk with your health care provider about: The risks and benefits of testing. How you can receive the lowest dose of radiation. What happens before the procedure? Wear comfortable clothing and remove any jewelry,  glasses, dentures, and hearing aids. Follow instructions from your health care provider about eating and drinking. This may include: For 12 hours before the procedure -- avoid caffeine. This includes tea, coffee, soda, energy drinks, and diet pills. Drink plenty of water or other fluids that do not have caffeine in them. Being well hydrated can prevent complications. For 4-6 hours before the procedure -- stop eating and drinking. The contrast dye can cause nausea, but this is less likely if your stomach is empty. Ask your health care provider about changing or stopping your regular medicines. This is especially important if you are taking diabetes medicines, blood thinners, or medicines to treat problems with erections (erectile dysfunction). What happens during the procedure?  Hair on your chest may need to be removed so that small sticky patches called electrodes can be placed on your chest. These will transmit information that helps to monitor your heart during the procedure. An IV will be inserted into one of your veins. You might be given a medicine to control your heart rate during the procedure. This will help to ensure that good images are obtained. You will be asked to lie on an exam table. This table will slide in and out  of the CT machine during the procedure. Contrast dye will be injected into the IV. You might feel warm, or you may get a metallic taste in your mouth. You will be given a medicine called nitroglycerin. This will relax or dilate the arteries in your heart. The table that you are lying on will move into the CT machine tunnel for the scan. The person running the machine will give you instructions while the scans are being done. You may be asked to: Keep your arms above your head. Hold your breath. Stay very still, even if the table is moving. When the scanning is complete, you will be moved out of the machine. The IV will be removed. The procedure may vary among health  care providers and hospitals. What can I expect after the procedure? After your procedure, it is common to have: A metallic taste in your mouth from the contrast dye. A feeling of warmth. A headache from the nitroglycerin. Follow these instructions at home: Take over-the-counter and prescription medicines only as told by your health care provider. If you are told, drink enough fluid to keep your urine pale yellow. This will help to flush the contrast dye out of your body. Most people can return to their normal activities right after the procedure. Ask your health care provider what activities are safe for you. It is up to you to get the results of your procedure. Ask your health care provider, or the department that is doing the procedure, when your results will be ready. Keep all follow-up visits as told by your health care provider. This is important. Contact a health care provider if: You have any symptoms of allergy to the contrast dye. These include: Shortness of breath. Rash or hives. A racing heartbeat. Summary A cardiac CT angiogram is a procedure to look at the heart and the area around the heart. It may be done to help find the cause of chest pains or other symptoms of heart disease. During this procedure, a large X-ray machine, called a CT scanner, takes detailed pictures of the heart and the surrounding area after a contrast dye has been injected into blood vessels in the area. Ask your health care provider about changing or stopping your regular medicines before the procedure. This is especially important if you are taking diabetes medicines, blood thinners, or medicines to treat erectile dysfunction. If you are told, drink enough fluid to keep your urine pale yellow. This will help to flush the contrast dye out of your body. This information is not intended to replace advice given to you by your health care provider. Make sure you discuss any questions you have with your health  care provider. Document Revised: 05/05/2019 Document Reviewed: 05/05/2019 Elsevier Patient Education  [place].

## 2021-05-26 LAB — BASIC METABOLIC PANEL
BUN/Creatinine Ratio: 18 (ref 12–28)
BUN: 13 mg/dL (ref 8–27)
CO2: 24 mmol/L (ref 20–29)
Calcium: 10 mg/dL (ref 8.7–10.3)
Chloride: 102 mmol/L (ref 96–106)
Creatinine, Ser: 0.74 mg/dL (ref 0.57–1.00)
Glucose: 93 mg/dL (ref 65–99)
Potassium: 5.3 mmol/L — ABNORMAL HIGH (ref 3.5–5.2)
Sodium: 141 mmol/L (ref 134–144)
eGFR: 85 mL/min/{1.73_m2} (ref >59)

## 2021-05-26 LAB — PRO B NATRIURETIC PEPTIDE: NT-Pro BNP: 57 pg/mL (ref 0–301)

## 2021-05-29 ENCOUNTER — Telehealth: Payer: Self-pay

## 2021-05-29 NOTE — Telephone Encounter (Signed)
Spoke with patient regarding results and recommendation.  Patient verbalizes understanding and is agreeable to plan of care. Advised patient to call back with any issues or concerns.  

## 2021-05-29 NOTE — Telephone Encounter (Signed)
-----   Message from Baldo Daub, MD sent at 05/28/2021 12:14 PM EDT ----- Normal or stable result  For cardiac CTA, the blood test to screen for heart failure is normal.

## 2021-06-01 ENCOUNTER — Telehealth (HOSPITAL_COMMUNITY): Payer: Self-pay | Admitting: *Deleted

## 2021-06-01 NOTE — Telephone Encounter (Signed)
Reaching out to patient to offer assistance regarding upcoming cardiac imaging study; pt verbalizes understanding of appt date/time, parking situation and where to check in, pre-test NPO status and medications ordered, and verified current allergies; name and call back number provided for further questions should they arise ° °Nyheem Binette RN Navigator Cardiac Imaging °[facility] °[phone] office °[phone] cell  ° °Patient to take 100mg metoprolol tartrate two hours prior to cardiac CT scan. °

## 2021-06-04 ENCOUNTER — Ambulatory Visit (HOSPITAL_COMMUNITY)
Admission: RE | Admit: 2021-06-04 | Discharge: 2021-06-04 | Disposition: A | Discharge status: Home / Self Care | Payer: Medicare PPO | Source: Ambulatory Visit | Attending: Cardiology | Admitting: Cardiology

## 2021-06-04 ENCOUNTER — Encounter (HOSPITAL_COMMUNITY): Payer: Self-pay

## 2021-06-04 ENCOUNTER — Encounter: Payer: Medicare PPO | Admitting: *Deleted

## 2021-06-04 ENCOUNTER — Other Ambulatory Visit: Payer: Self-pay

## 2021-06-04 DIAGNOSIS — I700 Atherosclerosis of aorta: Secondary | ICD-10-CM | POA: Diagnosis not present

## 2021-06-04 DIAGNOSIS — R079 Chest pain, unspecified: Secondary | ICD-10-CM | POA: Diagnosis not present

## 2021-06-04 DIAGNOSIS — I10 Essential (primary) hypertension: Secondary | ICD-10-CM | POA: Insufficient documentation

## 2021-06-04 DIAGNOSIS — Z006 Encounter for examination for normal comparison and control in clinical research program: Secondary | ICD-10-CM

## 2021-06-04 DIAGNOSIS — I2510 Atherosclerotic heart disease of native coronary artery without angina pectoris: Secondary | ICD-10-CM | POA: Insufficient documentation

## 2021-06-04 MED ORDER — NITROGLYCERIN 0.4 MG SL SUBL
SUBLINGUAL_TABLET | SUBLINGUAL | Status: AC
  Filled 2021-06-04: qty 2

## 2021-06-04 MED ORDER — IOHEXOL 350 MG/ML SOLN
95.0000 mL | Freq: Once | INTRAVENOUS | Status: AC | PRN
  Administered 2021-06-04: 95 mL via INTRAVENOUS

## 2021-06-04 MED ORDER — NITROGLYCERIN 0.4 MG SL SUBL
0.8000 mg | SUBLINGUAL_TABLET | Freq: Once | SUBLINGUAL | Status: AC
  Administered 2021-06-04: 0.8 mg via SUBLINGUAL

## 2021-06-04 NOTE — Research (Signed)
IDENTIFY Informed Consent        Subject Name:   Brianna Horne   Subject met inclusion and exclusion criteria.  The informed consent form, study requirements and expectations were reviewed with the subject and questions and concerns were addressed prior to the signing of the consent form.  The subject verbalized understanding of the trial requirements.  The subject agreed to participate in the IDENTIFYtrial and signed the informed consent.  The informed consent was obtained prior to performance of any protocol-specific procedures for the subject.  A copy of the signed informed consent was given to the subject and a copy was placed in the subject's medical record.   CONSENT WAS SIGNED AT 10:30am    Leota Jacobsen, BSN, RN Cardioivascular Research Nurse [facility] [phone]

## 2021-06-05 ENCOUNTER — Telehealth: Payer: Self-pay

## 2021-06-05 NOTE — Telephone Encounter (Signed)
Spoke with patient regarding results and recommendation.  Patient verbalizes understanding and is agreeable to plan of care. Advised patient to call back with any issues or concerns.  

## 2021-06-05 NOTE — Telephone Encounter (Signed)
-----   Message from Baldo Daub, MD sent at 06/05/2021 10:23 AM EDT ----- Overall a good report  Continue her statin with a calcium score greater than 100  Very mild atherosclerosis and plaque in the coronary arteries no blockage of concern I do not think think her shortness of breath is due to blocked arteries in her heart.

## 2021-08-09 DIAGNOSIS — E782 Mixed hyperlipidemia: Secondary | ICD-10-CM | POA: Diagnosis not present

## 2021-08-09 DIAGNOSIS — I10 Essential (primary) hypertension: Secondary | ICD-10-CM | POA: Diagnosis not present

## 2021-08-22 DIAGNOSIS — M810 Age-related osteoporosis without current pathological fracture: Secondary | ICD-10-CM | POA: Diagnosis not present

## 2021-08-26 NOTE — Progress Notes (Signed)
Cardiology Office Note:    Date:  08/27/2021   ID:  Brianna Horne, DOB 08-Mar-1946, MRN [id number]  PCP:  Merri Brunette, MD  Cardiologist:  Norman Herrlich, MD    Referring MD: Merri Brunette, MD    ASSESSMENT:    1. Shortness of breath   2. Agatston coronary artery calcium score between 100 and 199   3. Mild CAD   4. Primary hypertension   5. Mixed hyperlipidemia    PLAN:    In order of problems listed above:  She has no findings of cardiomyopathy heart failure or significant CAD I will think she requires further cardiac testing especially with a very low proBNP level.  I suspect her shortness of breath is a sequelae of cigarette smoking I have advised her to get involved in regular activity program Continue her statin Continue ARB Continue high intensity statin score greater 100   Next appointment: I will plan to see back in the office as needed in the future   Medication Adjustments/Labs and Tests Ordered: Current medicines are reviewed at length with the patient today.  Concerns regarding medicines are outlined above.  No orders of the defined types were placed in this encounter.  No orders of the defined types were placed in this encounter.   Chief Complaint  Patient presents with   Follow-up    After cardiac CTA   Shortness of Breath   .[facility]  History of Present Illness:    Brianna Horne is a 75 y.o. female with a hx of hypertension hyperlipidemia last seen 05/25/2021 with exertional shortness of breath.  Compliance with diet, lifestyle and medications: Yes  She is reassured by the results of her CTA. With a score greater than 100 she will remain on her high intensity statin. She is limited by foot pain asked her to consider other activities such as a recumbent bike She has background history of cigarette smoking of about 20 years. She is not having chest pain.  She underwent cardiac CTA reported 06/04/2021.  Coronary artery calcium score was  high greater than 100 at 135, 67 percentile she had no obstructive CAD present and had minimal plaque noted in the left anterior descending and first diagonal branch less than 25%.  A proBNP level was quite low at 57. Past Medical History:  Diagnosis Date   Headache(784.0)    Hypertension     Past Surgical History:  Procedure Laterality Date   ABDOMINAL HYSTERECTOMY     BREAST EXCISIONAL BIOPSY Bilateral    ORIF RADIAL FRACTURE Right 08/11/2016   Procedure: OPEN REDUCTION INTERNAL FIXATION (ORIF) RADIAL FRACTURE, OPEN REDUCTION INTERNAL FIXATION ULNA;  Surgeon: Dairl Ponder, MD;  Location: [facility] OR;  Service: Orthopedics;  Laterality: Right;    Current Medications: Current Meds  Medication Sig   Biotin 5000 MCG SUBL Place 5,000 mcg under the tongue daily.   Carboxymethylcellulose Sodium (EYE DROPS OP) Place 1 drop into both eyes daily. Bio Tru   Cholecalciferol (VITAMIN D) 2000 units tablet Take 4,000 Units by mouth daily.    Cyanocobalamin (VITAMIN B-12 PO) Take 1 tablet by mouth daily.   Denosumab (PROLIA McKenzie) Inject into the skin See admin instructions. Administered by Dr. Halina [facility] once every 6 months - last injection 07/25/16   docusate sodium (COLACE) 100 MG capsule Take 100 mg by mouth 2 (two) times daily.   famotidine (PEPCID) 20 MG tablet Take 20 mg by mouth daily.   losartan (COZAAR) 25 MG tablet Take 25  mg by mouth daily.   Polyethyl Glycol-Propyl Glycol (SYSTANE OP) Place 1 drop into both eyes daily as needed (dry eyes).   Psyllium (METAMUCIL PO) Take 3 capsules by mouth daily.   rosuvastatin (CRESTOR) 10 MG tablet Take 10 mg by mouth daily.   [DISCONTINUED] cetirizine (ZYRTEC) 10 MG tablet Take 10 mg by mouth daily.     Allergies:   Codeine, Lisinopril, Morphine and related, Oxycodone hcl, and Reglan [metoclopramide]   Social History   Socioeconomic History   Marital status: Widowed    Spouse name: Not on file   Number of children: Not on file    Years of education: Not on file   Highest education level: Not on file  Occupational History   Not on file  Tobacco Use   Smoking status: Former    Types: Cigarettes    Quit date: 02/10/1990    Years since quitting: 31.5   Smokeless tobacco: Never  Vaping Use   Vaping Use: Never used  Substance and Sexual Activity   Alcohol use: Yes    Alcohol/week: 7.0 standard drinks    Types: 7 Glasses of wine per week    Comment: wine   Drug use: No   Sexual activity: Not on file  Other Topics Concern   Not on file  Social History Narrative   Not on file   Social Determinants of Health   Financial Resource Strain: Not on file  Food Insecurity: Not on file  Transportation Needs: Not on file  Physical Activity: Not on file  Stress: Not on file  Social Connections: Not on file     Family History: The patient's family history includes Breast cancer in her maternal aunt; Heart attack in her mother; Lung cancer in her father. ROS:   Please see the history of present illness.    All other systems reviewed and are negative.  EKGs/Labs/Other Studies Reviewed:    The following studies were reviewed today:   Recent Labs: 05/25/2021: BUN 13; Creatinine, Ser 0.74; NT-Pro BNP 57; Potassium 5.3; Sodium 141  Recent Lipid Panel No results found for: CHOL, TRIG, HDL, CHOLHDL, VLDL, LDLCALC, LDLDIRECT  Physical Exam:    VS:  BP (!) 148/78   Pulse 78   Ht 5\' 4"  (1.626 m)   Wt 154 lb (69.9 kg)   SpO2 99%   BMI 26.43 kg/m     Wt Readings from Last 3 Encounters:  08/27/21 154 lb (69.9 kg)  05/25/21 147 lb 1.9 oz (66.7 kg)  09/03/18 148 lb 4 oz (67.2 kg)     GEN:  Well nourished, well developed in no acute distress HEENT: Normal NECK: No JVD; No carotid bruits LYMPHATICS: No lymphadenopathy CARDIAC: RRR, no murmurs, rubs, gallops RESPIRATORY:  Clear to auscultation without rales, wheezing or rhonchi  ABDOMEN: Soft, non-tender, non-distended MUSCULOSKELETAL:  No edema; No deformity   SKIN: Warm and dry NEUROLOGIC:  Alert and oriented x 3 PSYCHIATRIC:  Normal affect    Signed, 14/12/19, MD  08/27/2021 8:36 AM    [facility]

## 2021-08-27 ENCOUNTER — Ambulatory Visit: Payer: Medicare PPO | Admitting: Cardiology

## 2021-08-27 ENCOUNTER — Encounter: Payer: Self-pay | Admitting: Cardiology

## 2021-08-27 ENCOUNTER — Other Ambulatory Visit: Payer: Self-pay

## 2021-08-27 VITALS — BP 148/78 | HR 78 | Ht 64.0 in | Wt 154.0 lb

## 2021-08-27 DIAGNOSIS — I10 Essential (primary) hypertension: Secondary | ICD-10-CM

## 2021-08-27 DIAGNOSIS — E782 Mixed hyperlipidemia: Secondary | ICD-10-CM | POA: Diagnosis not present

## 2021-08-27 DIAGNOSIS — R0602 Shortness of breath: Secondary | ICD-10-CM

## 2021-08-27 DIAGNOSIS — R931 Abnormal findings on diagnostic imaging of heart and coronary circulation: Secondary | ICD-10-CM

## 2021-08-27 DIAGNOSIS — I2510 Atherosclerotic heart disease of native coronary artery without angina pectoris: Secondary | ICD-10-CM

## 2021-08-27 NOTE — Patient Instructions (Signed)

## 2021-10-01 DIAGNOSIS — L570 Actinic keratosis: Secondary | ICD-10-CM | POA: Diagnosis not present

## 2021-12-03 DIAGNOSIS — H5203 Hypermetropia, bilateral: Secondary | ICD-10-CM | POA: Diagnosis not present

## 2021-12-03 DIAGNOSIS — H2513 Age-related nuclear cataract, bilateral: Secondary | ICD-10-CM | POA: Diagnosis not present

## 2021-12-03 DIAGNOSIS — H52203 Unspecified astigmatism, bilateral: Secondary | ICD-10-CM | POA: Diagnosis not present

## 2021-12-05 DIAGNOSIS — R194 Change in bowel habit: Secondary | ICD-10-CM | POA: Diagnosis not present

## 2021-12-05 DIAGNOSIS — R1012 Left upper quadrant pain: Secondary | ICD-10-CM | POA: Diagnosis not present

## 2021-12-25 DIAGNOSIS — Z1389 Encounter for screening for other disorder: Secondary | ICD-10-CM | POA: Diagnosis not present

## 2021-12-25 DIAGNOSIS — M810 Age-related osteoporosis without current pathological fracture: Secondary | ICD-10-CM | POA: Diagnosis not present

## 2021-12-25 DIAGNOSIS — E782 Mixed hyperlipidemia: Secondary | ICD-10-CM | POA: Diagnosis not present

## 2021-12-25 DIAGNOSIS — I10 Essential (primary) hypertension: Secondary | ICD-10-CM | POA: Diagnosis not present

## 2021-12-25 DIAGNOSIS — Z1159 Encounter for screening for other viral diseases: Secondary | ICD-10-CM | POA: Diagnosis not present

## 2021-12-25 DIAGNOSIS — M2550 Pain in unspecified joint: Secondary | ICD-10-CM | POA: Diagnosis not present

## 2021-12-25 DIAGNOSIS — Z0000 Encounter for general adult medical examination without abnormal findings: Secondary | ICD-10-CM | POA: Diagnosis not present

## 2021-12-25 DIAGNOSIS — E559 Vitamin D deficiency, unspecified: Secondary | ICD-10-CM | POA: Diagnosis not present

## 2022-01-09 DIAGNOSIS — M25512 Pain in left shoulder: Secondary | ICD-10-CM | POA: Diagnosis not present

## 2022-01-09 DIAGNOSIS — M25511 Pain in right shoulder: Secondary | ICD-10-CM | POA: Diagnosis not present

## 2022-01-09 DIAGNOSIS — M359 Systemic involvement of connective tissue, unspecified: Secondary | ICD-10-CM | POA: Diagnosis not present

## 2022-01-09 DIAGNOSIS — M25561 Pain in right knee: Secondary | ICD-10-CM | POA: Diagnosis not present

## 2022-01-09 DIAGNOSIS — M79641 Pain in right hand: Secondary | ICD-10-CM | POA: Diagnosis not present

## 2022-01-09 DIAGNOSIS — M542 Cervicalgia: Secondary | ICD-10-CM | POA: Diagnosis not present

## 2022-01-09 DIAGNOSIS — M1990 Unspecified osteoarthritis, unspecified site: Secondary | ICD-10-CM | POA: Diagnosis not present

## 2022-01-09 DIAGNOSIS — M79642 Pain in left hand: Secondary | ICD-10-CM | POA: Diagnosis not present

## 2022-01-09 DIAGNOSIS — M25562 Pain in left knee: Secondary | ICD-10-CM | POA: Diagnosis not present

## 2022-01-09 DIAGNOSIS — M549 Dorsalgia, unspecified: Secondary | ICD-10-CM | POA: Diagnosis not present

## 2022-01-15 DIAGNOSIS — K130 Diseases of lips: Secondary | ICD-10-CM | POA: Diagnosis not present

## 2022-01-23 DIAGNOSIS — M542 Cervicalgia: Secondary | ICD-10-CM | POA: Diagnosis not present

## 2022-01-23 DIAGNOSIS — M1990 Unspecified osteoarthritis, unspecified site: Secondary | ICD-10-CM | POA: Diagnosis not present

## 2022-01-23 DIAGNOSIS — M549 Dorsalgia, unspecified: Secondary | ICD-10-CM | POA: Diagnosis not present

## 2022-01-23 DIAGNOSIS — M25511 Pain in right shoulder: Secondary | ICD-10-CM | POA: Diagnosis not present

## 2022-01-23 DIAGNOSIS — M359 Systemic involvement of connective tissue, unspecified: Secondary | ICD-10-CM | POA: Diagnosis not present

## 2022-02-21 DIAGNOSIS — M25562 Pain in left knee: Secondary | ICD-10-CM | POA: Diagnosis not present

## 2022-02-21 DIAGNOSIS — M810 Age-related osteoporosis without current pathological fracture: Secondary | ICD-10-CM | POA: Diagnosis not present

## 2022-03-05 DIAGNOSIS — M25562 Pain in left knee: Secondary | ICD-10-CM | POA: Diagnosis not present

## 2022-03-12 ENCOUNTER — Other Ambulatory Visit: Payer: Self-pay | Admitting: Family Medicine

## 2022-03-12 DIAGNOSIS — M25562 Pain in left knee: Secondary | ICD-10-CM | POA: Diagnosis not present

## 2022-03-12 DIAGNOSIS — Z1231 Encounter for screening mammogram for malignant neoplasm of breast: Secondary | ICD-10-CM

## 2022-03-18 ENCOUNTER — Other Ambulatory Visit: Payer: Self-pay | Admitting: Family Medicine

## 2022-03-18 DIAGNOSIS — E2839 Other primary ovarian failure: Secondary | ICD-10-CM

## 2022-03-18 DIAGNOSIS — M810 Age-related osteoporosis without current pathological fracture: Secondary | ICD-10-CM

## 2022-04-24 ENCOUNTER — Ambulatory Visit
Admission: RE | Admit: 2022-04-24 | Discharge: 2022-04-24 | Disposition: A | Discharge status: Home / Self Care | Payer: Medicare PPO | Source: Ambulatory Visit | Attending: Family Medicine | Admitting: Family Medicine

## 2022-04-24 DIAGNOSIS — E2839 Other primary ovarian failure: Secondary | ICD-10-CM

## 2022-04-24 DIAGNOSIS — Z780 Asymptomatic menopausal state: Secondary | ICD-10-CM | POA: Diagnosis not present

## 2022-04-24 DIAGNOSIS — M810 Age-related osteoporosis without current pathological fracture: Secondary | ICD-10-CM | POA: Diagnosis not present

## 2022-04-24 DIAGNOSIS — M85851 Other specified disorders of bone density and structure, right thigh: Secondary | ICD-10-CM | POA: Diagnosis not present

## 2022-05-03 ENCOUNTER — Ambulatory Visit
Admission: RE | Admit: 2022-05-03 | Discharge: 2022-05-03 | Disposition: A | Discharge status: Home / Self Care | Payer: Medicare PPO | Source: Ambulatory Visit | Attending: Family Medicine | Admitting: Family Medicine

## 2022-05-03 DIAGNOSIS — Z1231 Encounter for screening mammogram for malignant neoplasm of breast: Secondary | ICD-10-CM

## 2022-05-29 DIAGNOSIS — M546 Pain in thoracic spine: Secondary | ICD-10-CM | POA: Diagnosis not present

## 2022-06-27 DIAGNOSIS — E782 Mixed hyperlipidemia: Secondary | ICD-10-CM | POA: Diagnosis not present

## 2022-06-27 DIAGNOSIS — I10 Essential (primary) hypertension: Secondary | ICD-10-CM | POA: Diagnosis not present

## 2022-07-10 DIAGNOSIS — L812 Freckles: Secondary | ICD-10-CM | POA: Diagnosis not present

## 2022-07-10 DIAGNOSIS — D485 Neoplasm of uncertain behavior of skin: Secondary | ICD-10-CM | POA: Diagnosis not present

## 2022-07-10 DIAGNOSIS — L821 Other seborrheic keratosis: Secondary | ICD-10-CM | POA: Diagnosis not present

## 2022-07-10 DIAGNOSIS — D2271 Melanocytic nevi of right lower limb, including hip: Secondary | ICD-10-CM | POA: Diagnosis not present

## 2022-07-10 DIAGNOSIS — D1801 Hemangioma of skin and subcutaneous tissue: Secondary | ICD-10-CM | POA: Diagnosis not present

## 2022-07-10 DIAGNOSIS — L570 Actinic keratosis: Secondary | ICD-10-CM | POA: Diagnosis not present

## 2022-09-06 DIAGNOSIS — M810 Age-related osteoporosis without current pathological fracture: Secondary | ICD-10-CM | POA: Diagnosis not present

## 2022-09-09 DIAGNOSIS — M2021 Hallux rigidus, right foot: Secondary | ICD-10-CM | POA: Diagnosis not present

## 2022-09-09 DIAGNOSIS — M79671 Pain in right foot: Secondary | ICD-10-CM | POA: Diagnosis not present

## 2022-09-09 DIAGNOSIS — M7741 Metatarsalgia, right foot: Secondary | ICD-10-CM | POA: Diagnosis not present

## 2022-10-01 DIAGNOSIS — M2021 Hallux rigidus, right foot: Secondary | ICD-10-CM | POA: Diagnosis not present

## 2022-10-21 ENCOUNTER — Emergency Department (HOSPITAL_BASED_OUTPATIENT_CLINIC_OR_DEPARTMENT_OTHER): Payer: Medicare PPO

## 2022-10-21 ENCOUNTER — Emergency Department (HOSPITAL_BASED_OUTPATIENT_CLINIC_OR_DEPARTMENT_OTHER)
Admission: EM | Admit: 2022-10-21 | Discharge: 2022-10-21 | Disposition: A | Discharge status: Home / Self Care | Payer: Medicare PPO | Attending: Emergency Medicine | Admitting: Emergency Medicine

## 2022-10-21 DIAGNOSIS — M25512 Pain in left shoulder: Secondary | ICD-10-CM | POA: Insufficient documentation

## 2022-10-21 DIAGNOSIS — R0789 Other chest pain: Secondary | ICD-10-CM | POA: Diagnosis not present

## 2022-10-21 DIAGNOSIS — Z79899 Other long term (current) drug therapy: Secondary | ICD-10-CM | POA: Insufficient documentation

## 2022-10-21 DIAGNOSIS — I10 Essential (primary) hypertension: Secondary | ICD-10-CM | POA: Diagnosis not present

## 2022-10-21 DIAGNOSIS — R079 Chest pain, unspecified: Secondary | ICD-10-CM | POA: Diagnosis not present

## 2022-10-21 LAB — CBC WITH DIFFERENTIAL/PLATELET
Abs Immature Granulocytes: 0 10*3/uL (ref 0.00–0.07)
Basophils Absolute: 0 10*3/uL (ref 0.0–0.1)
Basophils Relative: 1 %
Eosinophils Absolute: 0.2 10*3/uL (ref 0.0–0.5)
Eosinophils Relative: 3 %
HCT: 36.6 % (ref 36.0–46.0)
Hemoglobin: 12.7 g/dL (ref 12.0–15.0)
Immature Granulocytes: 0 %
Lymphocytes Relative: 31 %
Lymphs Abs: 1.8 10*3/uL (ref 0.7–4.0)
MCH: 32.1 pg (ref 26.0–34.0)
MCHC: 34.7 g/dL (ref 30.0–36.0)
MCV: 92.4 fL (ref 80.0–100.0)
Monocytes Absolute: 0.5 10*3/uL (ref 0.1–1.0)
Monocytes Relative: 8 %
Neutro Abs: 3.5 10*3/uL (ref 1.7–7.7)
Neutrophils Relative %: 57 %
Platelets: 232 10*3/uL (ref 150–400)
RBC: 3.96 MIL/uL (ref 3.87–5.11)
RDW: 12.3 % (ref 11.5–15.5)
WBC: 6 10*3/uL (ref 4.0–10.5)
nRBC: 0 % (ref 0.0–0.2)

## 2022-10-21 LAB — TROPONIN I (HIGH SENSITIVITY)
Troponin I (High Sensitivity): 3 ng/L (ref <18)
Troponin I (High Sensitivity): 3 ng/L (ref <18)

## 2022-10-21 LAB — BASIC METABOLIC PANEL
Anion gap: 9 (ref 5–15)
BUN: 13 mg/dL (ref 8–23)
CO2: 25 mmol/L (ref 22–32)
Calcium: 9.1 mg/dL (ref 8.9–10.3)
Chloride: 106 mmol/L (ref 98–111)
Creatinine, Ser: 0.55 mg/dL (ref 0.44–1.00)
GFR, Estimated: >60 mL/min (ref >60)
Glucose, Bld: 97 mg/dL (ref 70–99)
Potassium: 4 mmol/L (ref 3.5–5.1)
Sodium: 140 mmol/L (ref 135–145)

## 2022-10-21 LAB — D-DIMER, QUANTITATIVE: D-Dimer, Quant: 0.34 ug/mL-FEU (ref 0.00–0.50)

## 2022-10-21 MED ORDER — KETOROLAC TROMETHAMINE 30 MG/ML IJ SOLN
30.0000 mg | Freq: Once | INTRAMUSCULAR | Status: AC
  Administered 2022-10-21: 30 mg via INTRAVENOUS
  Filled 2022-10-21: qty 1

## 2022-10-21 NOTE — Discharge Instructions (Signed)
You were seen today for right shoulder and chest pain.  Your workup today is reassuring including screening for blood clots and heart testing.  Follow-up with cardiology as an outpatient for definitive functional testing.

## 2022-10-21 NOTE — ED Notes (Signed)
Pt and significant other agreeable with d/c plan as discussed by provider - this nurse has verbally reinforced d/c instructions and provided pt with written copy.  Pt agreeable with d/c plan and denies any addl questions concerns needs- pt ambulatory independently at d/c (declines w/c) -gait steady; vitals stable; no distress/acute changes noted.

## 2022-10-21 NOTE — ED Provider Notes (Signed)
[facility] Provider Note   CSN: [id number] Arrival date & time: 10/21/22  0244     History  Chief Complaint  Patient presents with   Chest Pain    Pt ambulated to exam 14 c/o left sided chest pain 10/10 x 4 hours. Pt state the pain woke her up. Pt denies injury, denies NVD, denies SOB. Pt a/o x 4 skin warm dry intact. VSS NAD monitor applied, EKG completed. Pt did take 324mg  aspirin prior to arrival.    Demri Poulton is a 77 y.o. female.  HPI     This is a 77 year old female who presents with shoulder and chest pain.  Patient awoke from sleep several hours ago with throbbing left shoulder pain.  She also developed some pain under her left breast.  Denies any injury.  States the pain has been constant.  It is nonexertional.  It is not worse with deep breathing.  She did have surgery on the right foot 2 weeks ago.  Is not on any anticoagulants.  No recent fevers or cough.  Patient reports stress testing 2 years ago that was reassuring.  Otherwise no known history of heart disease.  She does have a history of hypertension.  Home Medications Prior to Admission medications   Medication Sig Start Date End Date Taking? Authorizing Provider  Biotin 5000 MCG SUBL Place 5,000 mcg under the tongue daily.    [provider]  Carboxymethylcellulose Sodium (EYE DROPS OP) Place 1 drop into both eyes daily. [facility]    [provider]  Cholecalciferol (VITAMIN D) 2000 units tablet Take 4,000 Units by mouth daily.     [provider]  Cyanocobalamin (VITAMIN B-12 PO) Take 1 tablet by mouth daily.    [provider]  Denosumab (PROLIA Hardwick) Inject into the skin See admin instructions. Administered by Dr. Cecille [facility] once every 6 months - last injection 07/25/16    [provider]  docusate sodium (COLACE) 100 MG capsule Take 100 mg by mouth 2 (two) times daily.    [provider]  famotidine  (PEPCID) 20 MG tablet Take 20 mg by mouth daily.    [provider]  losartan (COZAAR) 25 MG tablet Take 25 mg by mouth daily.    [provider]  Polyethyl Glycol-Propyl Glycol (SYSTANE OP) Place 1 drop into both eyes daily as needed (dry eyes).    [provider]  Psyllium (METAMUCIL PO) Take 3 capsules by mouth daily.    [provider]  rosuvastatin (CRESTOR) 10 MG tablet Take 10 mg by mouth daily.    [provider]      Allergies    Codeine, Lisinopril, Morphine and related, Oxycodone hcl, and Reglan [metoclopramide]    Review of Systems   Review of Systems  Constitutional:  Negative for fever.  Respiratory:  Negative for cough and shortness of breath.   Cardiovascular:  Positive for chest pain.  Gastrointestinal:  Negative for abdominal pain.  Musculoskeletal:        Shoulder pain  All other systems reviewed and are negative.   Physical Exam Updated Vital Signs BP 126/63   Pulse 66   Temp 98.5 F (36.9 C) (Oral)   Resp 15   Wt 65.8 kg   SpO2 98%   BMI 24.89 kg/m  Physical Exam Vitals and nursing note reviewed.  Constitutional:      Appearance: She is well-developed. She is not ill-appearing.  HENT:  Head: Normocephalic and atraumatic.  Eyes:     Pupils: Pupils are equal, round, and reactive to light.  Cardiovascular:     Rate and Rhythm: Normal rate and regular rhythm.     Heart sounds: Normal heart sounds.  Pulmonary:     Effort: Pulmonary effort is normal. No respiratory distress.     Breath sounds: No wheezing.  Abdominal:     General: Bowel sounds are normal.     Palpations: Abdomen is soft.  Musculoskeletal:     Cervical back: Neck supple.     Right lower leg: No edema.     Left lower leg: No edema.     Comments: Right lower extremity in walking boot  Skin:    General: Skin is warm and dry.  Neurological:     Mental Status: She is alert and oriented to person, place, and time.  Psychiatric:         Mood and Affect: Mood normal.     ED Results / Procedures / Treatments   Labs (all labs ordered are listed, but only abnormal results are displayed) Labs Reviewed  CBC WITH DIFFERENTIAL/PLATELET  BASIC METABOLIC PANEL  D-DIMER, QUANTITATIVE  TROPONIN I (HIGH SENSITIVITY)  TROPONIN I (HIGH SENSITIVITY)    EKG EKG Interpretation  Date/Time:  Monday October 21 2022 02:55:20 EST Ventricular Rate:  81 PR Interval:  167 QRS Duration: 91 QT Interval:  375 QTC Calculation: 436 R Axis:   55 Text Interpretation: Sinus rhythm Probable left atrial enlargement Confirmed by Ross Marcus ([id number]) on 10/21/2022 4:09:10 AM  Radiology DG Chest Portable 1 View  Result Date: 10/21/2022 CLINICAL DATA:  Left shoulder and chest pain. EXAM: PORTABLE CHEST 1 VIEW COMPARISON:  PA chest and rib series 09/28/2018 FINDINGS: The heart size and mediastinal contours are within normal limits. There is mild calcification in the aortic arch. Both lungs are clear with mild pulmonary overinflation. The visualized skeletal structures are grossly intact with osteopenia. IMPRESSION: No active disease. Mild pulmonary overinflation. Aortic atherosclerosis. Electronically Signed   By: Almira Bar M.D.   On: 10/21/2022 03:40    Procedures Procedures    Medications Ordered in ED Medications  ketorolac (TORADOL) 30 MG/ML injection 30 mg (30 mg Intravenous Given 10/21/22 0417)    ED Course/ Medical Decision Making/ A&P                             Medical Decision Making Amount and/or Complexity of Data Reviewed Labs: ordered. Radiology: ordered.  Risk Prescription drug management.   This patient presents to the ED for concern of shoulder pain, chest pain, this involves an extensive number of treatment options, and is a complaint that carries with it a high risk of complications and morbidity.  I considered the following differential and admission for this acute, potentially life threatening condition.   The differential diagnosis includes ACS, PE, pneumothorax, pneumonia, referred pain, musculoskeletal pain  MDM:    This is a 77 year old female who presents with left shoulder and left chest pain.  She is nontoxic and vital signs are reassuring.  Pain is fairly atypical.  EKG shows no evidence of acute ischemia or arrhythmia.  Troponin x 2 is negative.  Chest x-ray without pneumothorax or pneumonia.  I did send a screening D-dimer to evaluate for PE.  This is reassuring.  Effectively ruling out PE.  Patient was given Toradol with some improvement of symptoms.  Could be muscle spasm or  musculoskeletal pain.  I will refer her back to cardiology for evaluation if she needs further testing although I feel her symptoms are quite atypical.  In the meantime recommend ongoing anti-inflammatories.  (Labs, imaging, consults)  Labs: I Ordered, and personally interpreted labs.  The pertinent results include: CBC, BMP, troponin x 2  Imaging Studies ordered: I ordered imaging studies including chest x-ray I independently visualized and interpreted imaging. I agree with the radiologist interpretation  Additional history obtained from chart review.  External records from outside source obtained and reviewed including prior evaluations  Cardiac Monitoring: The patient was maintained on a cardiac monitor.  I personally viewed and interpreted the cardiac monitored which showed an underlying rhythm of: Normal sinus rhythm  Reevaluation: After the interventions noted above, I reevaluated the patient and found that they have :improved  Social Determinants of Health:  lives independently  Disposition: Discharge  Co morbidities that complicate the patient evaluation  Past Medical History:  Diagnosis Date   Headache(784.0)    Hypertension      Medicines Meds ordered this encounter  Medications   ketorolac (TORADOL) 30 MG/ML injection 30 mg    I have reviewed the patients home medicines and have made  adjustments as needed  Problem List / ED Course: Problem List Items Addressed This Visit   None Visit Diagnoses     Atypical chest pain    -  Primary   Relevant Orders   Ambulatory referral to Cardiology                   Final Clinical Impression(s) / ED Diagnoses Final diagnoses:  Atypical chest pain    Rx / DC Orders ED Discharge Orders          Ordered    Ambulatory referral to Cardiology        10/21/22 0547              Merryl Hacker, MD 10/21/22 [id number]

## 2022-10-23 ENCOUNTER — Telehealth: Payer: Self-pay

## 2022-10-23 NOTE — Telephone Encounter (Signed)
        Patient  visited [place] on 1/29    Telephone encounter attempt :  1st  A HIPAA compliant voice message was left requesting a return call.  Instructed patient to call back     [facility] [phone] [address], [place], [place] [address] Phone: [phone] Email: [email or web address]

## 2022-10-29 ENCOUNTER — Telehealth: Payer: Self-pay

## 2022-10-29 NOTE — Telephone Encounter (Signed)
     Patient  visit on 1/29  at [facility] Have you been able to follow up with your primary care physician? Yes   The patient was or was not able to obtain any needed medicine or equipment. Yes   Are there diet recommendations that you are having difficulty following? Na   Patient expresses understanding of discharge instructions and education provided has no other needs at this time.  Yes      [facility] [phone] [address], [place], [place] [address] Phone: [phone] Email: [email or web address]

## 2022-11-13 DIAGNOSIS — Z4789 Encounter for other orthopedic aftercare: Secondary | ICD-10-CM | POA: Diagnosis not present

## 2022-11-13 DIAGNOSIS — M2021 Hallux rigidus, right foot: Secondary | ICD-10-CM | POA: Diagnosis not present

## 2022-11-18 DIAGNOSIS — R0781 Pleurodynia: Secondary | ICD-10-CM | POA: Diagnosis not present

## 2022-12-05 DIAGNOSIS — H2513 Age-related nuclear cataract, bilateral: Secondary | ICD-10-CM | POA: Diagnosis not present

## 2022-12-16 DIAGNOSIS — M2021 Hallux rigidus, right foot: Secondary | ICD-10-CM | POA: Diagnosis not present

## 2022-12-16 DIAGNOSIS — Z4789 Encounter for other orthopedic aftercare: Secondary | ICD-10-CM | POA: Diagnosis not present

## 2022-12-23 ENCOUNTER — Ambulatory Visit: Payer: Medicare PPO | Admitting: Cardiology

## 2023-01-16 DIAGNOSIS — M546 Pain in thoracic spine: Secondary | ICD-10-CM | POA: Diagnosis not present

## 2023-01-16 DIAGNOSIS — M791 Myalgia, unspecified site: Secondary | ICD-10-CM | POA: Diagnosis not present

## 2023-01-21 DIAGNOSIS — I10 Essential (primary) hypertension: Secondary | ICD-10-CM | POA: Diagnosis not present

## 2023-01-21 DIAGNOSIS — Z1331 Encounter for screening for depression: Secondary | ICD-10-CM | POA: Diagnosis not present

## 2023-01-21 DIAGNOSIS — K219 Gastro-esophageal reflux disease without esophagitis: Secondary | ICD-10-CM | POA: Diagnosis not present

## 2023-01-21 DIAGNOSIS — M810 Age-related osteoporosis without current pathological fracture: Secondary | ICD-10-CM | POA: Diagnosis not present

## 2023-01-21 DIAGNOSIS — G43909 Migraine, unspecified, not intractable, without status migrainosus: Secondary | ICD-10-CM | POA: Diagnosis not present

## 2023-01-21 DIAGNOSIS — D509 Iron deficiency anemia, unspecified: Secondary | ICD-10-CM | POA: Diagnosis not present

## 2023-01-21 DIAGNOSIS — Z0000 Encounter for general adult medical examination without abnormal findings: Secondary | ICD-10-CM | POA: Diagnosis not present

## 2023-01-21 DIAGNOSIS — E782 Mixed hyperlipidemia: Secondary | ICD-10-CM | POA: Diagnosis not present

## 2023-01-31 ENCOUNTER — Other Ambulatory Visit: Payer: Self-pay | Admitting: Family Medicine

## 2023-01-31 DIAGNOSIS — Z1231 Encounter for screening mammogram for malignant neoplasm of breast: Secondary | ICD-10-CM

## 2023-03-10 DIAGNOSIS — M810 Age-related osteoporosis without current pathological fracture: Secondary | ICD-10-CM | POA: Diagnosis not present

## 2023-04-03 NOTE — Progress Notes (Signed)
Cardiology Office Note:  .    Date:  04/04/2023  ID:  Daryll Brod, DOB Apr 18, 1946, MRN [id number] PCP: Merri Brunette, MD  [facility] Providers Cardiologist:  None     History of Present Illness: .    Brianna Horne is a 77 y.o. female with non-obstructive CAD, hypertension, hyperlipidemia, who presents for evaluation of chest pain. She was seen by Dr. Wilkie Aye in the ED 10/21/2022 with complaints of left sided, constant nonexertional chest pain. EKG showed no evidence of acute ischemia or arrhythmia. Troponin x 2 negative. Chest x-ray without pneumothorax or pneumonia. D-dimer was reassuring. She was given Toradol with some improvement of symptoms. It was felt her chest pain could be muscle spasm or musculoskeletal pain. She was referred back to cardiology.   Previously followed by Dr. Dulce Sellar in cardiology with prior concerns for chest pain and shortness of breath, last seen by him 08/27/2021. She denied chest pain at that visit. As of 05/2021, proBNP level was quite low at 57. She had a cardiac CTA 05/2021 revealing a coronary calcium score of 135 which was 67th percentile. There was minimal plaque in the LAD and first diagonal branch less than 25%, but no obstructive CAD. Given her CAD she was continued on 10 mg rosuvastatin. Noted former smoker with a 20-year smoking history; she quit in 1991. Planned follow-up was as needed.  Today, she states she is feeling okay. She confirms that her pain prompting her ED visit had been localized to her left shoulder/arm as well as a sharp pain inferior to her left breast. At the time she was concerned as it had lasted for 3 hours. At this time she denies any recurring chest pains. Most of the time she exercises at the [facility]. May have typical aches and pains, but she denies any anginal symptoms. Her home blood pressures are stable such as 124 systolic when she last checked, although she has not monitored for a while. In the office today her blood  pressure is 108/68. At the time of her episode of chest pain her blood pressure had been elevated to the 190s. Occasional swelling of her right foot. She previously had joint fusion of her right great toe and was it told her recovery could take a year. Regarding her diet she prepares her meals at home. Also reports that her mother had her first heart attack around age 47 to 52; reportedly she didn't take care of her health. She denies any palpitations, shortness of breath, lightheadedness, headaches, syncope, orthopnea, or PND.  ROS:  Please see the history of present illness. All other systems are reviewed and negative.  (+) Mild right foot swelling  Studies Reviewed: Marland Kitchen       Coronary CT-A 05/2021:  IMPRESSION: 1. Coronary calcium score of 135. This was 67th percentile for age-, race-, and sex-matched controls.   2. Normal coronary origin with right dominance.   3. No evidence of obstructive CAD.   4.  Minimal (<25%) stenosis in teh LAD and D1.  CAD-RADS 1.   5.  Consider non-cardiac causes of chest pain.    Risk Assessment/Calculations:             Physical Exam:    VS:  BP 108/68   Pulse 66   Ht 5\' 4"  (1.626 m)   Wt 138 lb (62.6 kg)   BMI 23.69 kg/m  , BMI Body mass index is 23.69 kg/m. GENERAL:  Well appearing HEENT: Pupils equal round and reactive,  fundi not visualized, oral mucosa unremarkable NECK:  No jugular venous distention, waveform within normal limits, carotid upstroke brisk and symmetric, no bruits, no thyromegaly LUNGS:  Clear to auscultation bilaterally HEART:  RRR.  PMI not displaced or sustained,S1 and S2 within normal limits, no S3, no S4, no clicks, no rubs, no murmurs ABD:  Flat, positive bowel sounds normal in frequency in pitch, no bruits, no rebound, no guarding, no midline pulsatile mass, no hepatomegaly, no splenomegaly EXT:  2 plus pulses throughout, no edema, no cyanosis no clubbing SKIN:  No rashes no nodules NEURO:  Cranial nerves II through  XII grossly intact, motor grossly intact throughout PSYCH:  Cognitively intact, oriented to person place and time  Wt Readings from Last 3 Encounters:  04/04/23 138 lb (62.6 kg)  10/21/22 145 lb (65.8 kg)  08/27/21 154 lb (69.9 kg)     ASSESSMENT AND PLAN: .       # Non-obstructive CAD:  # Chest Pain:  # Hyperlipidemia:  Presented with sharp pain under the breast and shoulder, initially concerning for cardiac etiology. However, EKG and enzymes were normal. Pain resolved and was later attributed to torn cartilage by primary care physician. No recurrence of similar pain despite regular exercise. No exertional symptoms. -Continue current lifestyle modifications including regular exercise and healthy diet. - Continue rosuvastatin  # Hypertension: BP well-controlled on losartan.   Follow-up: No current cardiac symptoms or concerns. -Schedule routine follow-up in 1 year, sooner if any new symptoms arise.             Dispo:  FU with Alitzel Cookson C. Duke Salvia, MD, [facility] in 1 year.  I,Mathew Stumpf,acting as a Neurosurgeon for Chilton Si, MD.,have documented all relevant documentation on the behalf of Chilton Si, MD,as directed by  Chilton Si, MD while in the presence of Chilton Si, MD.  I, Jaemarie Hochberg C. Duke Salvia, MD have reviewed all documentation for this visit.  The documentation of the exam, diagnosis, procedures, and orders on 04/04/2023 are all accurate and complete.   Signed, Chilton Si, MD

## 2023-04-04 ENCOUNTER — Encounter (HOSPITAL_BASED_OUTPATIENT_CLINIC_OR_DEPARTMENT_OTHER): Payer: Self-pay | Admitting: Cardiovascular Disease

## 2023-04-04 ENCOUNTER — Ambulatory Visit (HOSPITAL_BASED_OUTPATIENT_CLINIC_OR_DEPARTMENT_OTHER): Payer: Medicare PPO | Admitting: Cardiovascular Disease

## 2023-04-04 VITALS — BP 108/68 | HR 66 | Ht 64.0 in | Wt 138.0 lb

## 2023-04-04 DIAGNOSIS — I10 Essential (primary) hypertension: Secondary | ICD-10-CM | POA: Diagnosis not present

## 2023-04-04 DIAGNOSIS — E7800 Pure hypercholesterolemia, unspecified: Secondary | ICD-10-CM

## 2023-04-04 HISTORY — DX: Pure hypercholesterolemia, unspecified: E78.00

## 2023-04-04 NOTE — Patient Instructions (Signed)
Medication Instructions:  The current medical regimen is effective;  continue present plan and medications.  *If you need a refill on your cardiac medications before your next appointment, please call your pharmacy*   Lab Work: N/A   Testing/Procedures: N/A   Follow-Up: At [facility], you and your health needs are our priority.  As part of our continuing mission to provide you with exceptional heart care, we have created designated Provider Care Teams.  These Care Teams include your primary Cardiologist (physician) and Advanced Practice Providers (APPs -  Physician Assistants and Nurse Practitioners) who all work together to provide you with the care you need, when you need it.  We recommend signing up for the patient portal called "MyChart".  Sign up information is provided on this After Visit Summary.  MyChart is used to connect with patients for Virtual Visits (Telemedicine).  Patients are able to view lab/test results, encounter notes, upcoming appointments, etc.  Non-urgent messages can be sent to your provider as well.   To learn more about what you can do with MyChart, go to [email or web address].    Your next appointment:   1 year(s)  Provider:   Chilton Si, MD   Other Instructions None

## 2023-05-05 ENCOUNTER — Ambulatory Visit
Admission: RE | Admit: 2023-05-05 | Discharge: 2023-05-05 | Disposition: A | Discharge status: Home / Self Care | Payer: Medicare PPO | Source: Ambulatory Visit | Attending: Family Medicine | Admitting: Family Medicine

## 2023-05-05 DIAGNOSIS — Z1231 Encounter for screening mammogram for malignant neoplasm of breast: Secondary | ICD-10-CM

## 2023-07-23 DIAGNOSIS — M25512 Pain in left shoulder: Secondary | ICD-10-CM | POA: Diagnosis not present

## 2023-07-23 DIAGNOSIS — M810 Age-related osteoporosis without current pathological fracture: Secondary | ICD-10-CM | POA: Diagnosis not present

## 2023-07-23 DIAGNOSIS — E782 Mixed hyperlipidemia: Secondary | ICD-10-CM | POA: Diagnosis not present

## 2023-07-23 DIAGNOSIS — I10 Essential (primary) hypertension: Secondary | ICD-10-CM | POA: Diagnosis not present

## 2023-08-05 DIAGNOSIS — L853 Xerosis cutis: Secondary | ICD-10-CM | POA: Diagnosis not present

## 2023-08-05 DIAGNOSIS — L812 Freckles: Secondary | ICD-10-CM | POA: Diagnosis not present

## 2023-08-05 DIAGNOSIS — L821 Other seborrheic keratosis: Secondary | ICD-10-CM | POA: Diagnosis not present

## 2023-08-05 DIAGNOSIS — L218 Other seborrheic dermatitis: Secondary | ICD-10-CM | POA: Diagnosis not present

## 2023-08-05 DIAGNOSIS — D1801 Hemangioma of skin and subcutaneous tissue: Secondary | ICD-10-CM | POA: Diagnosis not present

## 2023-08-25 DIAGNOSIS — M546 Pain in thoracic spine: Secondary | ICD-10-CM | POA: Diagnosis not present

## 2023-08-25 DIAGNOSIS — M791 Myalgia, unspecified site: Secondary | ICD-10-CM | POA: Diagnosis not present

## 2023-09-10 DIAGNOSIS — M810 Age-related osteoporosis without current pathological fracture: Secondary | ICD-10-CM | POA: Diagnosis not present

## 2023-10-16 DIAGNOSIS — M542 Cervicalgia: Secondary | ICD-10-CM | POA: Diagnosis not present

## 2023-10-16 DIAGNOSIS — Z79899 Other long term (current) drug therapy: Secondary | ICD-10-CM | POA: Diagnosis not present

## 2024-01-21 DIAGNOSIS — M546 Pain in thoracic spine: Secondary | ICD-10-CM | POA: Diagnosis not present

## 2024-01-21 DIAGNOSIS — G8929 Other chronic pain: Secondary | ICD-10-CM | POA: Diagnosis not present

## 2024-01-21 DIAGNOSIS — M791 Myalgia, unspecified site: Secondary | ICD-10-CM | POA: Diagnosis not present

## 2024-02-20 DIAGNOSIS — G8929 Other chronic pain: Secondary | ICD-10-CM | POA: Diagnosis not present

## 2024-02-20 DIAGNOSIS — M546 Pain in thoracic spine: Secondary | ICD-10-CM | POA: Diagnosis not present

## 2024-02-23 DIAGNOSIS — Z1331 Encounter for screening for depression: Secondary | ICD-10-CM | POA: Diagnosis not present

## 2024-02-23 DIAGNOSIS — E782 Mixed hyperlipidemia: Secondary | ICD-10-CM | POA: Diagnosis not present

## 2024-02-23 DIAGNOSIS — E559 Vitamin D deficiency, unspecified: Secondary | ICD-10-CM | POA: Diagnosis not present

## 2024-02-23 DIAGNOSIS — M810 Age-related osteoporosis without current pathological fracture: Secondary | ICD-10-CM | POA: Diagnosis not present

## 2024-02-23 DIAGNOSIS — Z0000 Encounter for general adult medical examination without abnormal findings: Secondary | ICD-10-CM | POA: Diagnosis not present

## 2024-02-23 DIAGNOSIS — K5901 Slow transit constipation: Secondary | ICD-10-CM | POA: Diagnosis not present

## 2024-02-23 DIAGNOSIS — Z23 Encounter for immunization: Secondary | ICD-10-CM | POA: Diagnosis not present

## 2024-02-23 DIAGNOSIS — I10 Essential (primary) hypertension: Secondary | ICD-10-CM | POA: Diagnosis not present

## 2024-02-23 DIAGNOSIS — G43909 Migraine, unspecified, not intractable, without status migrainosus: Secondary | ICD-10-CM | POA: Diagnosis not present

## 2024-02-26 ENCOUNTER — Other Ambulatory Visit (HOSPITAL_BASED_OUTPATIENT_CLINIC_OR_DEPARTMENT_OTHER): Payer: Self-pay | Admitting: Family Medicine

## 2024-02-26 DIAGNOSIS — M810 Age-related osteoporosis without current pathological fracture: Secondary | ICD-10-CM

## 2024-02-27 DIAGNOSIS — M542 Cervicalgia: Secondary | ICD-10-CM | POA: Diagnosis not present

## 2024-02-27 DIAGNOSIS — M546 Pain in thoracic spine: Secondary | ICD-10-CM | POA: Diagnosis not present

## 2024-02-27 DIAGNOSIS — G8929 Other chronic pain: Secondary | ICD-10-CM | POA: Diagnosis not present

## 2024-03-11 DIAGNOSIS — M810 Age-related osteoporosis without current pathological fracture: Secondary | ICD-10-CM | POA: Diagnosis not present

## 2024-03-23 ENCOUNTER — Other Ambulatory Visit: Payer: Self-pay | Admitting: Family Medicine

## 2024-03-23 DIAGNOSIS — Z1231 Encounter for screening mammogram for malignant neoplasm of breast: Secondary | ICD-10-CM

## 2024-04-06 ENCOUNTER — Other Ambulatory Visit: Payer: Self-pay | Admitting: Physician Assistant

## 2024-04-06 DIAGNOSIS — H9202 Otalgia, left ear: Secondary | ICD-10-CM

## 2024-04-06 DIAGNOSIS — R1310 Dysphagia, unspecified: Secondary | ICD-10-CM

## 2024-04-06 DIAGNOSIS — R070 Pain in throat: Secondary | ICD-10-CM

## 2024-04-13 ENCOUNTER — Ambulatory Visit
Admission: RE | Admit: 2024-04-13 | Discharge: 2024-04-13 | Disposition: A | Discharge status: Home / Self Care | Source: Ambulatory Visit | Attending: Physician Assistant | Admitting: Physician Assistant

## 2024-04-13 DIAGNOSIS — M47814 Spondylosis without myelopathy or radiculopathy, thoracic region: Secondary | ICD-10-CM | POA: Diagnosis not present

## 2024-04-13 DIAGNOSIS — R070 Pain in throat: Secondary | ICD-10-CM | POA: Diagnosis not present

## 2024-04-13 DIAGNOSIS — H9202 Otalgia, left ear: Secondary | ICD-10-CM

## 2024-04-13 DIAGNOSIS — R1310 Dysphagia, unspecified: Secondary | ICD-10-CM

## 2024-04-13 DIAGNOSIS — I700 Atherosclerosis of aorta: Secondary | ICD-10-CM | POA: Diagnosis not present

## 2024-04-13 DIAGNOSIS — M47812 Spondylosis without myelopathy or radiculopathy, cervical region: Secondary | ICD-10-CM | POA: Diagnosis not present

## 2024-04-13 MED ORDER — IOPAMIDOL (ISOVUE-300) INJECTION 61%
75.0000 mL | Freq: Once | INTRAVENOUS | Status: AC | PRN
  Administered 2024-04-13: 75 mL via INTRAVENOUS

## 2024-05-05 ENCOUNTER — Ambulatory Visit

## 2024-05-06 ENCOUNTER — Ambulatory Visit
Admission: RE | Admit: 2024-05-06 | Discharge: 2024-05-06 | Disposition: A | Discharge status: Home / Self Care | Source: Ambulatory Visit | Attending: Family Medicine | Admitting: Family Medicine

## 2024-05-06 DIAGNOSIS — Z1231 Encounter for screening mammogram for malignant neoplasm of breast: Secondary | ICD-10-CM

## 2024-05-06 DIAGNOSIS — K649 Unspecified hemorrhoids: Secondary | ICD-10-CM | POA: Diagnosis not present

## 2024-05-06 DIAGNOSIS — K625 Hemorrhage of anus and rectum: Secondary | ICD-10-CM | POA: Diagnosis not present

## 2024-05-06 DIAGNOSIS — R1310 Dysphagia, unspecified: Secondary | ICD-10-CM | POA: Diagnosis not present

## 2024-06-23 ENCOUNTER — Ambulatory Visit (HOSPITAL_BASED_OUTPATIENT_CLINIC_OR_DEPARTMENT_OTHER)
Admission: RE | Admit: 2024-06-23 | Discharge: 2024-06-23 | Disposition: A | Discharge status: Home / Self Care | Source: Ambulatory Visit | Attending: Family Medicine | Admitting: Family Medicine

## 2024-06-23 DIAGNOSIS — M810 Age-related osteoporosis without current pathological fracture: Secondary | ICD-10-CM | POA: Diagnosis not present

## 2024-06-23 DIAGNOSIS — M8589 Other specified disorders of bone density and structure, multiple sites: Secondary | ICD-10-CM | POA: Diagnosis not present

## 2024-06-23 DIAGNOSIS — Z780 Asymptomatic menopausal state: Secondary | ICD-10-CM | POA: Diagnosis not present

## 2024-07-06 DIAGNOSIS — R1310 Dysphagia, unspecified: Secondary | ICD-10-CM | POA: Diagnosis not present

## 2024-07-06 DIAGNOSIS — K625 Hemorrhage of anus and rectum: Secondary | ICD-10-CM | POA: Diagnosis not present

## 2024-07-06 DIAGNOSIS — K219 Gastro-esophageal reflux disease without esophagitis: Secondary | ICD-10-CM | POA: Diagnosis not present

## 2024-07-06 DIAGNOSIS — K649 Unspecified hemorrhoids: Secondary | ICD-10-CM | POA: Diagnosis not present

## 2024-07-07 DIAGNOSIS — R0989 Other specified symptoms and signs involving the circulatory and respiratory systems: Secondary | ICD-10-CM | POA: Diagnosis not present

## 2024-07-07 DIAGNOSIS — R042 Hemoptysis: Secondary | ICD-10-CM | POA: Diagnosis not present

## 2024-08-05 DIAGNOSIS — R1310 Dysphagia, unspecified: Secondary | ICD-10-CM | POA: Diagnosis not present

## 2024-08-05 DIAGNOSIS — K2950 Unspecified chronic gastritis without bleeding: Secondary | ICD-10-CM | POA: Diagnosis not present

## 2024-08-12 DIAGNOSIS — L821 Other seborrheic keratosis: Secondary | ICD-10-CM | POA: Diagnosis not present

## 2024-08-12 DIAGNOSIS — L812 Freckles: Secondary | ICD-10-CM | POA: Diagnosis not present

## 2024-08-12 DIAGNOSIS — D485 Neoplasm of uncertain behavior of skin: Secondary | ICD-10-CM | POA: Diagnosis not present

## 2024-08-12 DIAGNOSIS — L570 Actinic keratosis: Secondary | ICD-10-CM | POA: Diagnosis not present

## 2024-08-12 DIAGNOSIS — D225 Melanocytic nevi of trunk: Secondary | ICD-10-CM | POA: Diagnosis not present

## 2024-08-24 DIAGNOSIS — E782 Mixed hyperlipidemia: Secondary | ICD-10-CM | POA: Diagnosis not present

## 2024-08-24 DIAGNOSIS — I10 Essential (primary) hypertension: Secondary | ICD-10-CM | POA: Diagnosis not present
